# Patient Record
Sex: Female | Born: 1940 | Race: White | Hispanic: No | Marital: Married | Smoking: Former smoker
Health system: Southern US, Community
[De-identification: ages and names within clinical notes are randomized; demographics above are authoritative.]

## PROBLEM LIST (undated history)

## (undated) DIAGNOSIS — I341 Nonrheumatic mitral (valve) prolapse: Secondary | ICD-10-CM

## (undated) DIAGNOSIS — E785 Hyperlipidemia, unspecified: Secondary | ICD-10-CM

## (undated) DIAGNOSIS — N3281 Overactive bladder: Secondary | ICD-10-CM

## (undated) DIAGNOSIS — J449 Chronic obstructive pulmonary disease, unspecified: Secondary | ICD-10-CM

## (undated) DIAGNOSIS — I2510 Atherosclerotic heart disease of native coronary artery without angina pectoris: Secondary | ICD-10-CM

## (undated) DIAGNOSIS — Z8679 Personal history of other diseases of the circulatory system: Secondary | ICD-10-CM

## (undated) DIAGNOSIS — I872 Venous insufficiency (chronic) (peripheral): Secondary | ICD-10-CM

## (undated) DIAGNOSIS — I213 ST elevation (STEMI) myocardial infarction of unspecified site: Secondary | ICD-10-CM

## (undated) HISTORY — DX: Nonrheumatic mitral (valve) prolapse: I34.1

## (undated) HISTORY — DX: Chronic obstructive pulmonary disease, unspecified: J44.9

## (undated) HISTORY — PX: DILATION AND CURETTAGE OF UTERUS: SHX78

## (undated) HISTORY — DX: Hyperlipidemia, unspecified: E78.5

## (undated) HISTORY — PX: COLONOSCOPY: SHX174

## (undated) HISTORY — PX: BREAST LUMPECTOMY: SHX2

## (undated) HISTORY — DX: Overactive bladder: N32.81

## (undated) HISTORY — DX: Atherosclerotic heart disease of native coronary artery without angina pectoris: I25.10

## (undated) HISTORY — DX: ST elevation (STEMI) myocardial infarction of unspecified site: I21.3

## (undated) HISTORY — DX: Personal history of other diseases of the circulatory system: Z86.79

## (undated) HISTORY — PX: MELANOMA EXCISION: SHX5266

## (undated) HISTORY — DX: Venous insufficiency (chronic) (peripheral): I87.2

## (undated) HISTORY — PX: CATARACT EXTRACTION: SUR2

---

## 2006-09-21 ENCOUNTER — Other Ambulatory Visit: Admission: RE | Admit: 2006-09-21 | Discharge: 2006-09-21 | Payer: Self-pay | Admitting: Family Medicine

## 2007-08-11 ENCOUNTER — Ambulatory Visit (HOSPITAL_COMMUNITY): Admission: RE | Admit: 2007-08-11 | Discharge: 2007-08-11 | Payer: Self-pay | Admitting: Ophthalmology

## 2007-09-29 ENCOUNTER — Ambulatory Visit (HOSPITAL_COMMUNITY): Admission: RE | Admit: 2007-09-29 | Discharge: 2007-09-29 | Payer: Self-pay | Admitting: Ophthalmology

## 2008-05-23 HISTORY — PX: US ECHOCARDIOGRAPHY: HXRAD669

## 2008-05-23 HISTORY — PX: NM MYOCAR PERF WALL MOTION: HXRAD629

## 2009-03-06 ENCOUNTER — Inpatient Hospital Stay (HOSPITAL_COMMUNITY): Admission: EM | Admit: 2009-03-06 | Discharge: 2009-03-08 | Payer: Self-pay | Admitting: Emergency Medicine

## 2009-03-07 HISTORY — PX: CARDIAC CATHETERIZATION: SHX172

## 2009-08-03 DIAGNOSIS — I213 ST elevation (STEMI) myocardial infarction of unspecified site: Secondary | ICD-10-CM

## 2009-08-03 HISTORY — DX: ST elevation (STEMI) myocardial infarction of unspecified site: I21.3

## 2009-10-15 ENCOUNTER — Ambulatory Visit (HOSPITAL_COMMUNITY): Admission: RE | Admit: 2009-10-15 | Discharge: 2009-10-15 | Payer: Self-pay | Admitting: Family Medicine

## 2010-07-05 HISTORY — PX: ABLATION SAPHENOUS VEIN W/ RFA: SUR11

## 2010-07-11 LAB — HM MAMMOGRAPHY

## 2010-07-20 IMAGING — CT CT CHEST W/ CM
3 series · 17 of 29 positions shown, 19 images · IV contrast (80 ml omni 300)
Comparison: Plain film of earlier today.

CLINICAL DATA: Chest pain.  Evaluate anterior lung density on plain
film.

CT CHEST WITH CONTRAST
TECHNIQUE: Multidetector CT imaging of the chest was performed
following the standard protocol during bolus administration of
intravenous contrast.
Contrast: 80 ml Rmnipaque-ZPP

[Series 2: routine chest · axial · 0.59mm/px · z∈[-276,-56]mm · 7 of 62 slices shown, 9 images]
[im 9/62  mediastinal]
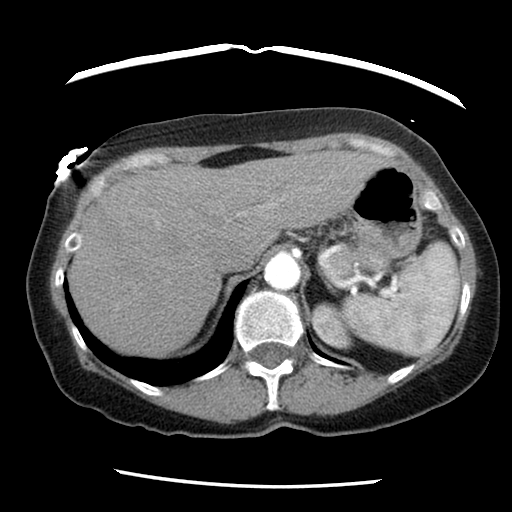
[im 9/62  lung]
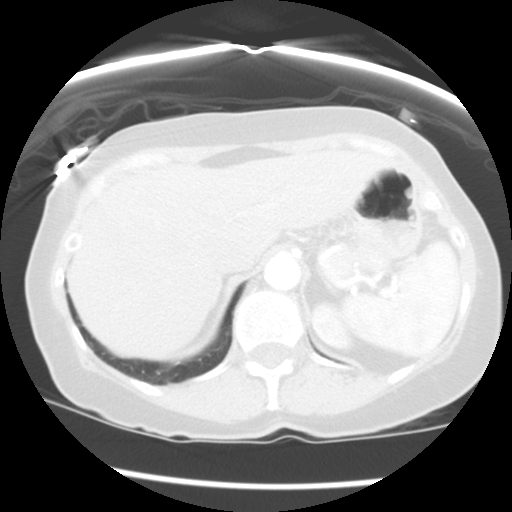
[im 18/62  lung]
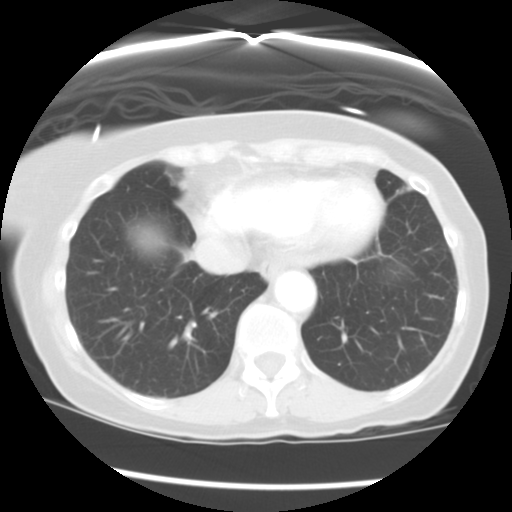
[im 27/62  lung]
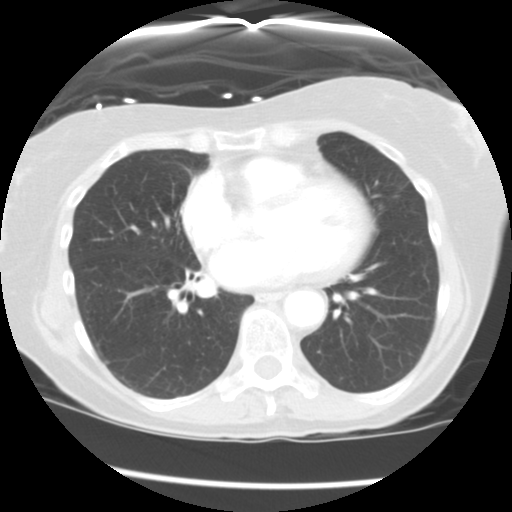
[im 30/62  lung]
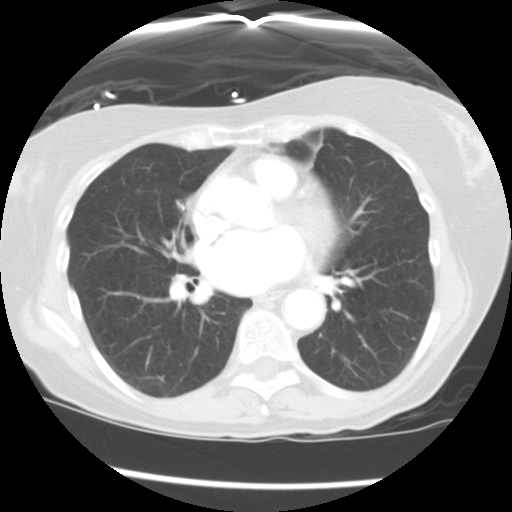
[im 35/62  mediastinal]
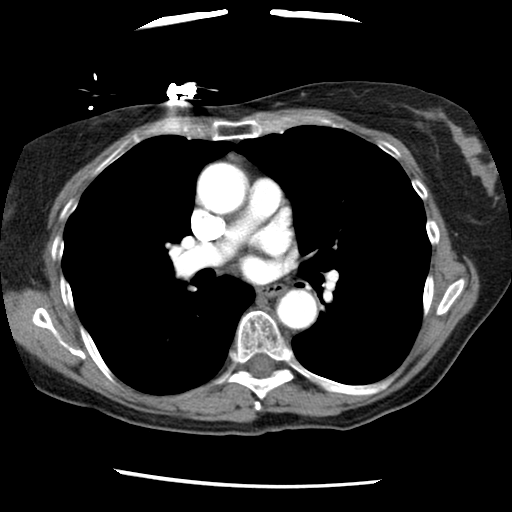
[im 35/62  lung]
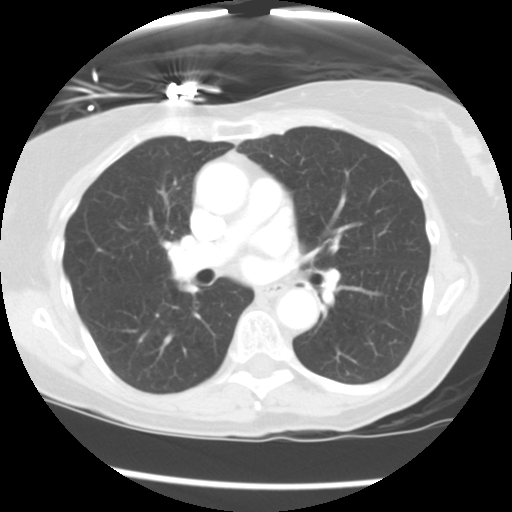
[im 44/62  lung]
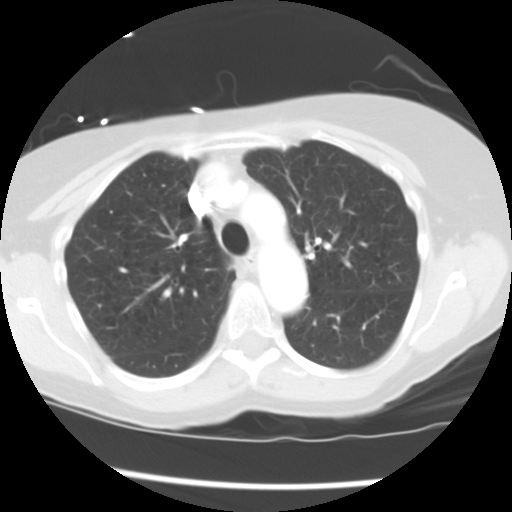
[im 53/62  lung]
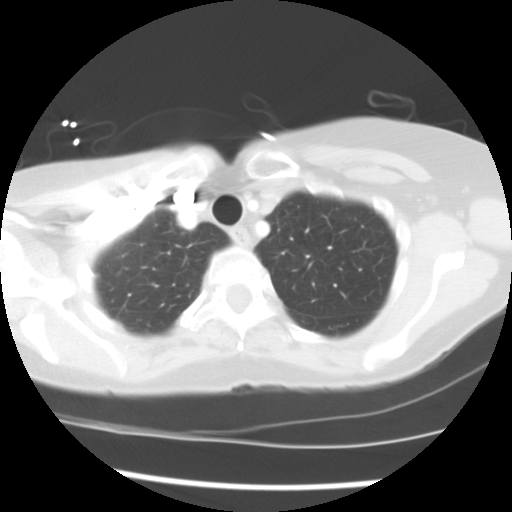

[Series 400: reformatted · sagittal · 0.61mm/px · 8 of 84 slices shown (1 of 2)]
[im 9/84  lung]
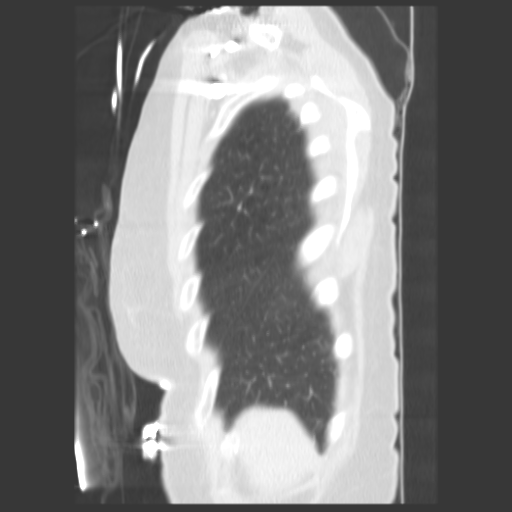
[im 17/84  lung]
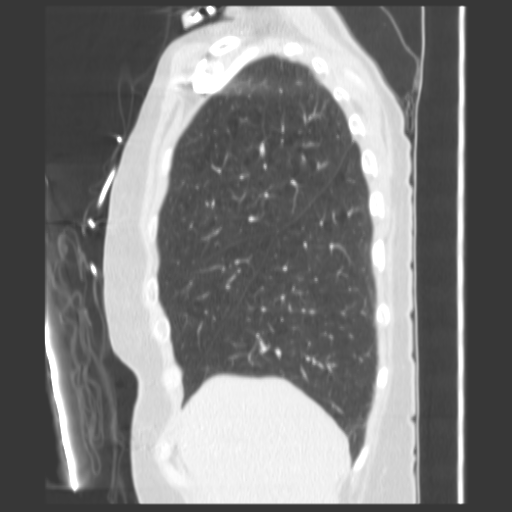
[im 25/84  lung]
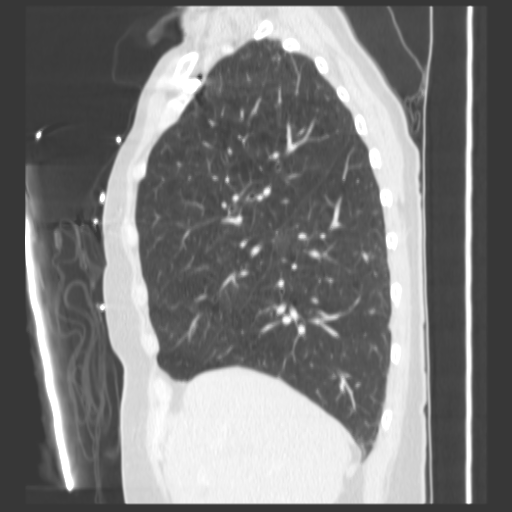
[im 34/84  lung]
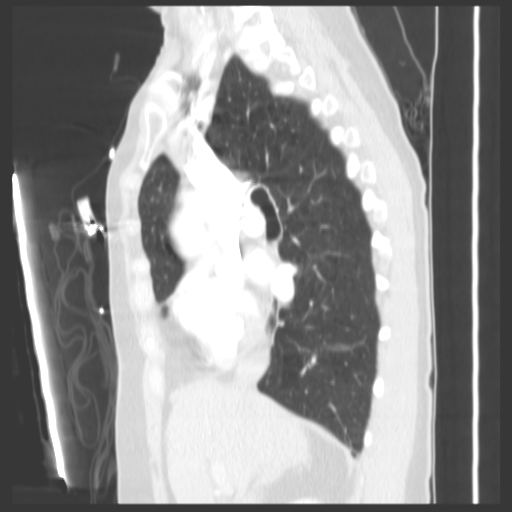
[im 50/84  lung]
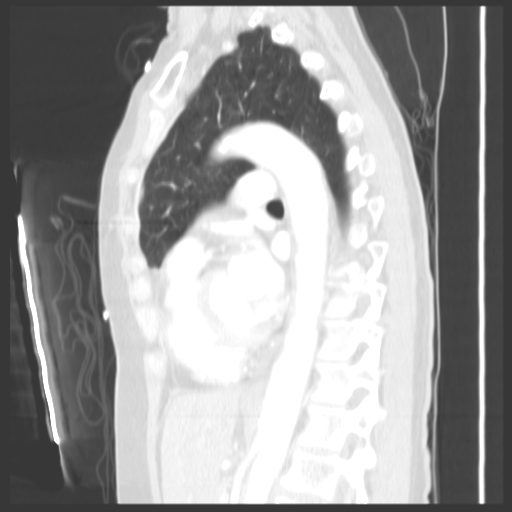
[im 59/84  lung]
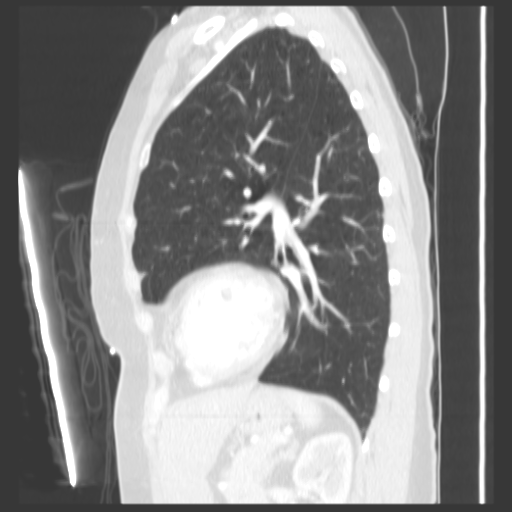
[im 67/84  lung]
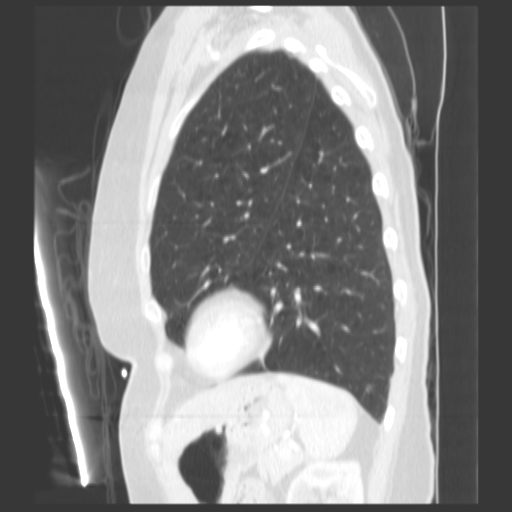
[im 75/84  lung]
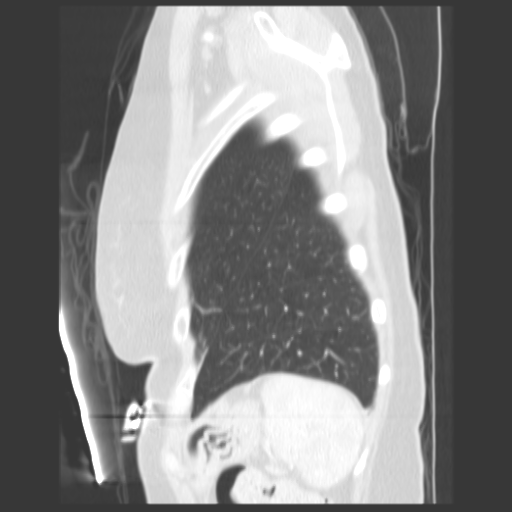

[Series 401: reformatted · coronal · 0.61mm/px · 2 of 64 slices shown (2 of 2)]
[im 8/64  lung]
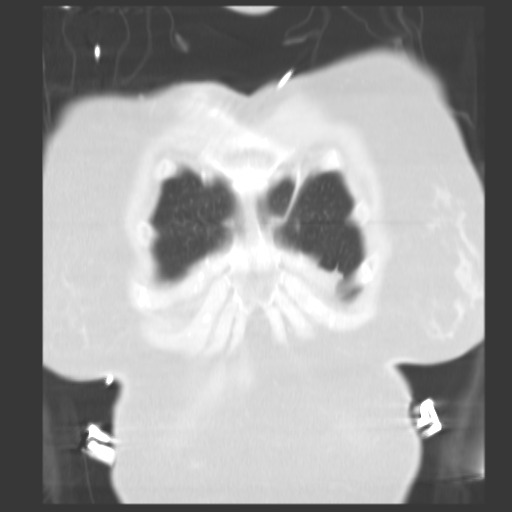
[im 16/64  lung]
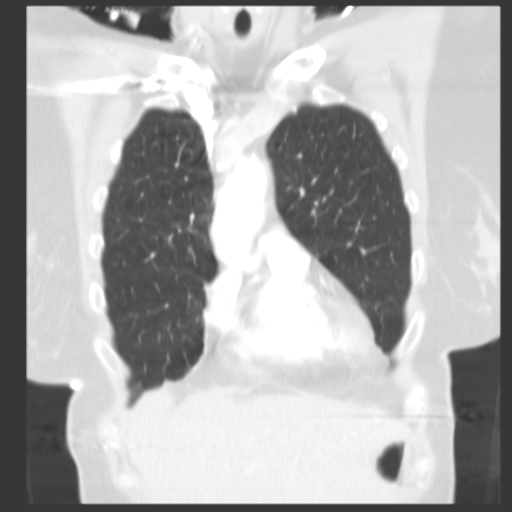

[17 of 29 positions shown; findings below may reference images not displayed]

FINDINGS: Lung windows demonstrate mild to moderate centrilobular
emphysema.  The plain film abnormality corresponds to a right upper
lobe calcified nodule consistent with a benign granuloma.  7 mm on
image 32.

2 mm right lower lobe nodule on image 42.

Soft tissue windows demonstrate mild cardiomegaly accentuated by
pectus excavatum deformity. No pericardial or pleural effusion.  No
mediastinal or hilar adenopathy. Mild left atrial enlargement.

Limited abdominal imaging demonstrates no significant findings.
Mild osteopenia.  Probable fatty infiltration of the liver.
IMPRESSION: 1. No acute process in the chest.
2.  The plain film abnormality corresponds to a right upper lobe
benign granuloma.
3.  Centrilobular emphysema with a 2 mm right lower lobe lung
nodule. Given risk factors for bronchogenic carcinoma, follow-up
chest CT at 1 year is recommended.  This recommendation follows the
consensus statement:  "Guidelines for Management of Small Pulmonary
Nodules Detected on CT Scans:  A Statement from the [employer]
online at:  [URL]
4.  Probable fatty infiltration of the liver.
5.  Cardiomegaly and left atrial enlargement accentuated by pectus
excavatum deformity.

## 2010-10-13 ENCOUNTER — Other Ambulatory Visit: Payer: Self-pay | Admitting: Family Medicine

## 2010-10-13 DIAGNOSIS — R918 Other nonspecific abnormal finding of lung field: Secondary | ICD-10-CM

## 2010-10-15 ENCOUNTER — Ambulatory Visit (HOSPITAL_COMMUNITY)
Admission: RE | Admit: 2010-10-15 | Discharge: 2010-10-15 | Disposition: A | Discharge status: Home / Self Care | Payer: Medicare Other | Source: Ambulatory Visit | Attending: Family Medicine | Admitting: Family Medicine

## 2010-10-15 DIAGNOSIS — J984 Other disorders of lung: Secondary | ICD-10-CM | POA: Insufficient documentation

## 2010-10-15 DIAGNOSIS — Z09 Encounter for follow-up examination after completed treatment for conditions other than malignant neoplasm: Secondary | ICD-10-CM | POA: Insufficient documentation

## 2010-10-15 DIAGNOSIS — R918 Other nonspecific abnormal finding of lung field: Secondary | ICD-10-CM

## 2010-11-06 ENCOUNTER — Encounter: Payer: Self-pay | Admitting: Nurse Practitioner

## 2010-11-08 LAB — COMPREHENSIVE METABOLIC PANEL
ALT: 26 U/L (ref 0–35)
AST: 32 U/L (ref 0–37)
Alkaline Phosphatase: 41 U/L (ref 39–117)
CO2: 24 mEq/L (ref 19–32)
Calcium: 8.8 mg/dL (ref 8.4–10.5)
Chloride: 109 mEq/L (ref 96–112)
Creatinine, Ser: 0.9 mg/dL (ref 0.4–1.2)
Glucose, Bld: 87 mg/dL (ref 70–99)
Sodium: 138 mEq/L (ref 135–145)
Total Protein: 5.8 g/dL — ABNORMAL LOW (ref 6.0–8.3)

## 2010-11-08 LAB — LIPID PANEL
Total CHOL/HDL Ratio: 2 RATIO
VLDL: 19 mg/dL (ref 0–40)

## 2010-11-08 LAB — CARDIAC PANEL(CRET KIN+CKTOT+MB+TROPI)
CK, MB: 7.4 ng/mL — ABNORMAL HIGH (ref 0.3–4.0)
Relative Index: INVALID (ref 0.0–2.5)
Total CK: 85 U/L (ref 7–177)
Total CK: 97 U/L (ref 7–177)

## 2010-11-08 LAB — CBC
HCT: 38.2 % (ref 36.0–46.0)
HCT: 39.1 % (ref 36.0–46.0)
Hemoglobin: 12.9 g/dL (ref 12.0–15.0)
Hemoglobin: 13.3 g/dL (ref 12.0–15.0)
MCHC: 33.9 g/dL (ref 30.0–36.0)
MCHC: 34 g/dL (ref 30.0–36.0)
MCV: 91.5 fL (ref 78.0–100.0)
MCV: 92 fL (ref 78.0–100.0)
Platelets: 190 10*3/uL (ref 150–400)
RBC: 4.18 MIL/uL (ref 3.87–5.11)
RBC: 4.26 MIL/uL (ref 3.87–5.11)
RBC: 4.46 MIL/uL (ref 3.87–5.11)
RDW: 13.8 % (ref 11.5–15.5)
WBC: 5.4 10*3/uL (ref 4.0–10.5)

## 2010-11-08 LAB — BASIC METABOLIC PANEL
BUN: 5 mg/dL — ABNORMAL LOW (ref 6–23)
CO2: 23 mEq/L (ref 19–32)
Chloride: 111 mEq/L (ref 96–112)
Creatinine, Ser: 0.77 mg/dL (ref 0.4–1.2)
Potassium: 4.1 mEq/L (ref 3.5–5.1)

## 2010-11-08 LAB — POCT I-STAT, CHEM 8
Calcium, Ion: 1.14 mmol/L (ref 1.12–1.32)
Chloride: 107 mEq/L (ref 96–112)
Creatinine, Ser: 1 mg/dL (ref 0.4–1.2)
Glucose, Bld: 89 mg/dL (ref 70–99)
HCT: 42 % (ref 36.0–46.0)
Hemoglobin: 14.3 g/dL (ref 12.0–15.0)
TCO2: 25 mmol/L (ref 0–100)

## 2010-11-08 LAB — DIFFERENTIAL
Basophils Absolute: 0 10*3/uL (ref 0.0–0.1)
Basophils Relative: 1 % (ref 0–1)
Eosinophils Absolute: 0.1 10*3/uL (ref 0.0–0.7)

## 2010-11-08 LAB — POCT CARDIAC MARKERS: Troponin i, poc: 0.08 ng/mL (ref 0.00–0.09)

## 2010-11-08 LAB — PROTIME-INR
INR: 0.9 (ref 0.00–1.49)
Prothrombin Time: 12.5 seconds (ref 11.6–15.2)

## 2010-11-08 LAB — MAGNESIUM: Magnesium: 2.4 mg/dL (ref 1.5–2.5)

## 2010-11-08 LAB — CK TOTAL AND CKMB (NOT AT ARMC): CK, MB: 3.4 ng/mL (ref 0.3–4.0)

## 2010-12-16 NOTE — H&P (Signed)
NAME:  Christine Sandoval, Christine Sandoval                ACCOUNT NO.:  [id number]   MEDICAL RECORD NO.:  [id number]          PATIENT TYPE:  EMS   LOCATION:  MAJO                         FACILITY:  [facility]   PHYSICIAN:  Joylene John, MD       DATE OF BIRTH:  09/20/1940   DATE OF ADMISSION:  03/06/2009  DATE OF DISCHARGE:                              HISTORY & PHYSICAL   REASON FOR ADMISSION:  Chest pain.   HISTORY OF PRESENT ILLNESS:  This is a 70 year old white female with  past medical history of MI at age 32, coming in with chest pain similar  to her episode when she was 78.  According to the patient, the pain  started earlier this morning.  She was not physically exerting at that  time when it started.  According to the patient, the pain was squeezing  in nature, it is retrosternal in location with radiating to the front  and back, 3-4 on a scale of 10.  The patient tells me the pain was  associated with nausea, diaphoresis, and jaw pain.  It was similar to  the episode that she had when she was 40.  The patient tells me at that  time she had an MI because of her lifestyle which included excessive  smoking and drinking.  The patient denies any change in her exercise  tolerance.  The patient denies any fevers, chills, or shortness of  breath.  No aggravating factors for the pain and the pain relieved about  1-1/2 hours after it started.  The patient thinks that the aspirin  helped.   PAST MEDICAL HISTORY:  1. MI x2 at age 45 when she was in [place].  This is the first time      that the patient is at this hospital.  2. Hypertension, borderline, on no meds.   FAMILY HISTORY:  No family history of coronary artery disease.   SOCIAL HISTORY:  Lives with family, is currently visiting.  No smoking  or alcohol since 1983.   ALLERGIES:  CODEINE.   PHYSICAL EXAMINATION:  VITAL SIGNS:  Temperature 97.5, pulse of 59,  respirations 18, blood pressure 147/86, and O2 sat is 98% on room air.  HEENT:  No  icterus, pallor, or JVD appreciated.  CARDIOVASCULAR:  Regular rate and rhythm.  LUNGS:  Clear to auscultation.  LOWER EXTREMITIES:  No edema appreciated.  Left lower extremity, she  does have varicose veins.   IMAGING:  EKG:  Normal sinus rhythm at 58 plus bradycardia.  No ST-T  changes.  No old EKG to compare.   LABORATORY DATA:  First set of cardiac enzymes as negative, troponin  though is borderline with 0.08.  Hemoglobin 14.3 and hematocrit 42.  White count and platelets are not available.  Sodium 138, potassium 4.1,  chloride 107, bicarb 25, BUN 15, creatinine 1, and glucose 89.  Chest x-  ray:  COPD.  No acute pathology noted.   ASSESSMENT AND PLAN:  Plan is to admit the patient to a tele bed to do  serial cardiac enzymes.  The patient is on  multivitamins, we will hold  for now.  The patient is not on any blood pressure medications.  His  blood pressure is at goal.  We will hold blood pressure medications at  this time to avoid hypotension.  We will not put the patient on a beta-  blocker since her heart rate is already in the 50s.  We will continue  the aspirin and the sublingual nitro once the  patient is ruled out with tele and serial cardiac enzymes.  The patient  does have a PCP on the outside and she tells me that she had a complete  cardiac workup including a stress test last year.  So, a decision is to  be made as the patient needs to be in the hospital for further workup or  if an outpatient appointment can be set up.      Joylene John, MD  Electronically Signed     RP/MEDQ  D:  03/06/2009  T:  03/07/2009  Job:  [id number]

## 2010-12-16 NOTE — Discharge Summary (Signed)
NAME:  Christine Sandoval, Christine Sandoval                ACCOUNT NO.:  [id number]   MEDICAL RECORD NO.:  [id number]          PATIENT TYPE:  INP   LOCATION:  3705                         FACILITY:  [facility]   PHYSICIAN:  Monte Fantasia, MD  DATE OF BIRTH:  10/05/1940   DATE OF ADMISSION:  03/06/2009  DATE OF DISCHARGE:  03/08/2009                               DISCHARGE SUMMARY   PRIMARY CARE PHYSICIAN:  Ernestina Penna, MD, in [place].   DISCHARGE DIAGNOSIS:  Acute coronary syndrome, status post cardiac, cath  is normal.   MEDICATIONS:  1. Aspirin 325 mg p.o. daily.  2. Vytorin 10/20 one tablet p.o. at bedtime.  3. Vesicare 5 mg p.o. at bedtime.  4. Calcium carbonate 4 times a day.  5. Aspirin 81 mg p.o. daily.  6. Actonel 150 mg p.o. monthly.  7. Toprol-XL 25 mg half a tablet p.o. daily.   COURSE DURING THE HOSPITAL STAY:  Ms. Tuller is a pleasant 70 year old  Caucasian lady patient was admitted on March 06, 2009, with complaints  of increasing chest pain.  She stated that her chest pain was similar to  the episode at the time she was 40.  The patient had been admitted to  telemetry bed and cardiac enzymes were cycled, which showed elevated  troponins of 0.63 and 0.61.  The patient was evaluated by [facility] and underwent a cardiac cath on March 07, 2009, as  per the cath report.  The patient had normal coronaries, normal LV  function, and recommended that the patient was medically stable to be  discharged and to follow up with Dr. Lynnea Ferrier as an outpatient in the  [place] office on March 25, 2009, at 9:30.  The patient at present  is medically stable to be discharged and can be discharged home.  The  patient is recommended to follow up with her primary care physician as  an outpatient.   RADIOLOGICAL INVESTIGATIONS DONE DURING THE STAY IN THE HOSPITAL:  CT  chest with contrast done on March 07, 2009.  Impression:  No acute  process in the chest.  Plain film abdominal  that corresponds to right  upper lobe benign granuloma, centrilobular emphysema with 2 mm right  lower lobe lung nodule.  Given the risk factors for bronchogenic  carcinoma, follow up CT chest in a year recommended.  Probable fatty  infiltration of the liver, cardiomegaly, and left atrial enlargement  accentuated by pectus excavatum deformity.   Chest x-ray done on March 07, 2009.  Impression:  Slight hyperinflation  and configuration with no evidence of pneumonia or pulmonary edema.  On  the lateral image, there is a nodular density opacity in the  retrosternal region, measures about 8.8 into 9.8 mm, the margins of  slightly irregular, may reflect a granuloma, but concerned about a  slight spiculation.   LABORATORIES DONE DURING THE STAY IN THE HOSPITAL:  Total WBC 5.4,  hemoglobin 13.3, hematocrit 39.1, and platelets of 173.  Sodium 138,  potassium 4.1, chloride 111, bicarb 23, BUN 5, creatinine 0.77, calcium  of 8.3, total cholesterol 161, triglycerides  93, HDL 79, LDL 63, and  VLDL 19.   DISPOSITION:  The patient is medically stable to be discharge.  She is  recommended to follow up with Dr. Lynnea Ferrier as an outpatient in the  [place] office on March 25, 2009, at 9:30 a.m.  The patient is also  recommended to follow up with her primary care physician.  The patient  would need a repeat CT chest with contrast as a follow up in a year for  possible granulomatous nodularity in the right lower lobe.   TOTAL TIME FOR DISCHARGE:  40 minutes, include 20 minutes of counseling.       Monte Fantasia, MD  Electronically Signed     MP/MEDQ  D:  03/08/2009  T:  03/08/2009  Job:  [id number]   cc:   Ernestina Penna, M.D.  Ritta Slot, MD

## 2011-01-23 ENCOUNTER — Ambulatory Visit (AMBULATORY_SURGERY_CENTER): Payer: Medicare Other | Admitting: *Deleted

## 2011-01-23 VITALS — Ht 63.0 in | Wt 129.0 lb

## 2011-01-23 DIAGNOSIS — Z86010 Personal history of colonic polyps: Secondary | ICD-10-CM

## 2011-01-23 MED ORDER — PEG-KCL-NACL-NASULF-NA ASC-C 100 G PO SOLR: 1.0000 | Freq: Once | ORAL | Status: AC

## 2011-01-23 NOTE — Progress Notes (Signed)
Patient has history of polyps. She is going to try to find name of [facility] hospital where most recent colonoscopy was performed by Dr. Nelva Nay (sp?). She is mailing [place] a signed release of information form as soon as she finds this info, for [place] to fax to facility. Is unsure of [place] hospital's name or the doctor that performed her first colonoscopy.

## 2011-02-02 ENCOUNTER — Telehealth: Payer: Self-pay | Admitting: Internal Medicine

## 2011-02-02 MED ORDER — PEG-KCL-NACL-NASULF-NA ASC-C 100 G PO SOLR: ORAL | Status: DC

## 2011-02-02 NOTE — Telephone Encounter (Signed)
SPOKE WITH PATIENT AND LET HER KNOW THAT I  RESENT HER MOVIPREP INTO [facility]. I ALSO CALLED PHARMACY AND LEFT MESSAGE FOR THIS RX. Christine Sandoval

## 2011-02-05 ENCOUNTER — Encounter: Payer: Self-pay | Admitting: Internal Medicine

## 2011-02-05 ENCOUNTER — Ambulatory Visit (AMBULATORY_SURGERY_CENTER): Payer: Medicare Other | Admitting: Internal Medicine

## 2011-02-05 VITALS — BP 154/76 | HR 64 | Temp 96.8°F | Resp 12 | Ht 63.0 in | Wt 129.0 lb

## 2011-02-05 DIAGNOSIS — Z1211 Encounter for screening for malignant neoplasm of colon: Secondary | ICD-10-CM

## 2011-02-05 DIAGNOSIS — Z86010 Personal history of colonic polyps: Secondary | ICD-10-CM

## 2011-02-05 LAB — HM COLONOSCOPY

## 2011-02-05 MED ORDER — SODIUM CHLORIDE 0.9 % IV SOLN: 500.0000 mL | INTRAVENOUS | Status: DC

## 2011-02-05 NOTE — Patient Instructions (Signed)
Follow discharge instructions.  Continue your medications.  Follow up Colonoscopy in 5 years.

## 2011-02-06 ENCOUNTER — Telehealth: Payer: Self-pay | Admitting: *Deleted

## 2011-02-06 NOTE — Telephone Encounter (Signed)

## 2011-05-08 LAB — BASIC METABOLIC PANEL
BUN: 24 — ABNORMAL HIGH
CO2: 28
Calcium: 10.6 — ABNORMAL HIGH
Glucose, Bld: 93
Sodium: 137

## 2013-02-07 ENCOUNTER — Other Ambulatory Visit: Payer: Self-pay | Admitting: Nurse Practitioner

## 2013-02-15 ENCOUNTER — Other Ambulatory Visit (INDEPENDENT_AMBULATORY_CARE_PROVIDER_SITE_OTHER): Payer: Medicare Other

## 2013-02-15 DIAGNOSIS — Z0000 Encounter for general adult medical examination without abnormal findings: Secondary | ICD-10-CM

## 2013-02-15 DIAGNOSIS — E785 Hyperlipidemia, unspecified: Secondary | ICD-10-CM

## 2013-02-15 DIAGNOSIS — I10 Essential (primary) hypertension: Secondary | ICD-10-CM

## 2013-02-17 ENCOUNTER — Other Ambulatory Visit: Payer: Self-pay

## 2013-02-21 ENCOUNTER — Ambulatory Visit (INDEPENDENT_AMBULATORY_CARE_PROVIDER_SITE_OTHER): Payer: Medicare Other | Admitting: Family Medicine

## 2013-02-21 ENCOUNTER — Encounter: Payer: Self-pay | Admitting: Family Medicine

## 2013-02-21 VITALS — BP 117/74 | HR 79 | Temp 98.4°F | Ht 63.0 in | Wt 126.6 lb

## 2013-02-21 DIAGNOSIS — M810 Age-related osteoporosis without current pathological fracture: Secondary | ICD-10-CM | POA: Insufficient documentation

## 2013-02-21 DIAGNOSIS — J449 Chronic obstructive pulmonary disease, unspecified: Secondary | ICD-10-CM | POA: Insufficient documentation

## 2013-02-21 DIAGNOSIS — E785 Hyperlipidemia, unspecified: Secondary | ICD-10-CM | POA: Insufficient documentation

## 2013-02-21 DIAGNOSIS — I2510 Atherosclerotic heart disease of native coronary artery without angina pectoris: Secondary | ICD-10-CM | POA: Insufficient documentation

## 2013-02-21 DIAGNOSIS — I201 Angina pectoris with documented spasm: Secondary | ICD-10-CM | POA: Insufficient documentation

## 2013-02-21 LAB — HEPATIC FUNCTION PANEL
ALT: 47 U/L — ABNORMAL HIGH (ref 0–35)
AST: 52 U/L — ABNORMAL HIGH (ref 0–37)
Bilirubin, Direct: 0.1 mg/dL (ref 0.0–0.3)
Indirect Bilirubin: 0.3 mg/dL (ref 0.0–0.9)

## 2013-02-21 LAB — BASIC METABOLIC PANEL WITH GFR
CO2: 28 mEq/L (ref 19–32)
Calcium: 9.8 mg/dL (ref 8.4–10.5)
Glucose, Bld: 97 mg/dL (ref 70–99)
Potassium: 5 mEq/L (ref 3.5–5.3)
Sodium: 141 mEq/L (ref 135–145)

## 2013-02-21 NOTE — Addendum Note (Signed)
Addended by: Orma Render F on: 02/21/2013 10:31 AM   Modules accepted: Orders

## 2013-02-21 NOTE — Progress Notes (Signed)
Patient ID: Christine Sandoval, female   DOB: Aug 15, 1940, 72 y.o.   MRN: [id number] SUBJECTIVE: CC: Chief Complaint  Patient presents with  . Follow-up    discuss labs   wants off vytotin taking 1/2 dose now  no leg spasms no more diarrhea    HPI: Breakfast: flat wheat bread with cheese and mustard, sliced [place] Lunch: largest meal, baked fried chicken, green beans, tomatoes. broccoli Dinner: fruit cup teaspoon of soluble fiber crackers. Snack at night diet fudgcicle  Patient is here for follow up of hyperlipidemia: denies Headache;denies Chest Pain;denies weakness;denies Shortness of Breath and orthopnea;denies Visual changes;denies palpitations;denies cough;denies pedal edema;denies symptoms of TIA or stroke;deniesClaudication symptoms. admits to Compliance with medications; Problems with medications. And reduced the Vytorin by 1/2 and symptoms of myalgias has resolved.  Past Medical History  Diagnosis Date  . CAD (coronary artery disease)   . OAB (overactive bladder)   . Hyperlipidemia   . Osteoporosis   . Mitral valve prolapse     mild  . COPD (chronic obstructive pulmonary disease)    Past Surgical History  Procedure Laterality Date  . Colonoscopy      history of polyps  . Melanoma excision    . Breast lumpectomy      benign  . Dilation and curettage of uterus       X 2  . Cataract extraction      bilateral eyes   History   Social History  . Marital Status: Married    Spouse Name: N/A    Number of Children: N/A  . Years of Education: N/A   Occupational History  . Not on file.   Social History Main Topics  . Smoking status: Former Smoker    Types: Cigarettes    Quit date: 08/03/1988  . Smokeless tobacco: Never Used  . Alcohol Use: No  . Drug Use: No  . Sexually Active: Not on file   Other Topics Concern  . Not on file   Social History Narrative  . No narrative on file   Family History  Problem Relation Age of Onset  . Colon cancer Maternal Aunt    . Colon cancer Maternal Uncle   . Alcohol abuse Mother   . Heart disease Sister 32    CHF   Current Outpatient Prescriptions on File Prior to Visit  Medication Sig Dispense Refill  . Ascorbic Acid (VITAMIN C) 1000 MG tablet Take 1,000 mg by mouth daily.        Marland Kitchen aspirin 81 MG tablet Take 162 mg by mouth daily.       . B Complex Vitamins (B-COMPLEX/B-12 PO) Take 1 capsule by mouth daily.        . calcium carbonate 200 MG capsule Take 1,000 mg by mouth 2 (two) times daily with a meal.        . ezetimibe-simvastatin (VYTORIN) 10-40 MG per tablet Take 1 tablet by mouth at bedtime.        . fish oil-omega-3 fatty acids 1000 MG capsule Take 1 g by mouth daily.        . Multiple Vitamin (MULTIVITAMIN) capsule Take 1 capsule by mouth daily.        . Cholecalciferol (VITAMIN D) 1000 UNITS capsule Take 4,000 Units by mouth daily.        . Cranberry 450 MG CAPS Take 1 capsule by mouth.        . peg 3350 powder (MOVIPREP) 100 G SOLR MOVIPREP-TAKE AS DIRECTED  1  kit  0   Current Facility-Administered Medications on File Prior to Visit  Medication Dose Route Frequency Provider Last Rate Last Dose  . 0.9 %  sodium chloride infusion  500 mL Intravenous Continuous Hilarie Fredrickson, MD       Allergies  Allergen Reactions  . Codeine   . Hydrochlorothiazide    Immunization History  Administered Date(s) Administered  . Influenza Whole 05/04/2007, 05/03/2009  . Td 03/21/2008   Prior to Admission medications   Medication Sig Start Date End Date Taking? Authorizing Provider  Arginine 500 MG CAPS Take 1 capsule by mouth daily. L-Arginine   Yes Historical Provider, MD  Ascorbic Acid (VITAMIN C) 1000 MG tablet Take 1,000 mg by mouth daily.     Yes Historical Provider, MD  aspirin 81 MG tablet Take 162 mg by mouth daily.    Yes Historical Provider, MD  B Complex Vitamins (B-COMPLEX/B-12 PO) Take 1 capsule by mouth daily.     Yes Historical Provider, MD  calcium carbonate 200 MG capsule Take 1,000 mg by mouth  2 (two) times daily with a meal.     Yes Historical Provider, MD  Coenzyme Q10 (CO Q 10) 100 MG CAPS Take 1 capsule by mouth daily.   Yes Historical Provider, MD  ezetimibe-simvastatin (VYTORIN) 10-40 MG per tablet Take 1 tablet by mouth at bedtime.     Yes Historical Provider, MD  fish oil-omega-3 fatty acids 1000 MG capsule Take 1 g by mouth daily.     Yes Historical Provider, MD  Chilton Si Tea, Camillia sinensis, ([employer] SUPER GREEN TEA EXTRACT) 250 MG CAPS Take 1 capsule by mouth daily. Takes 350 egcg bid   Yes Historical Provider, MD  Multiple Vitamin (MULTIVITAMIN) capsule Take 1 capsule by mouth daily.     Yes Historical Provider, MD  vitamin B-12 (CYANOCOBALAMIN) 1000 MCG tablet Take 1,000 mcg by mouth daily.   Yes Historical Provider, MD  Cholecalciferol (VITAMIN D) 1000 UNITS capsule Take 4,000 Units by mouth daily.      Historical Provider, MD  Cranberry 450 MG CAPS Take 1 capsule by mouth.      Historical Provider, MD  peg 3350 powder (MOVIPREP) 100 G SOLR MOVIPREP-TAKE AS DIRECTED 02/02/11   Hilarie Fredrickson, MD      Past Medical History  Diagnosis Date  . CAD (coronary artery disease)   . OAB (overactive bladder)   . Hyperlipidemia   . Osteoporosis   . Mitral valve prolapse     mild  . COPD (chronic obstructive pulmonary disease)    Past Surgical History  Procedure Laterality Date  . Colonoscopy      history of polyps  . Melanoma excision    . Breast lumpectomy      benign  . Dilation and curettage of uterus       X 2  . Cataract extraction      bilateral eyes   History   Social History  . Marital Status: Married    Spouse Name: N/A    Number of Children: N/A  . Years of Education: N/A   Occupational History  . Not on file.   Social History Main Topics  . Smoking status: Former Smoker    Types: Cigarettes    Quit date: 08/03/1988  . Smokeless tobacco: Never Used  . Alcohol Use: No  . Drug Use: No  . Sexually Active: Not on file   Other Topics Concern  . Not  on file   Social History  Narrative  . No narrative on file   Family History  Problem Relation Age of Onset  . Colon cancer Maternal Aunt   . Colon cancer Maternal Uncle   . Alcohol abuse Mother   . Heart disease Sister 22    CHF   Current Outpatient Prescriptions on File Prior to Visit  Medication Sig Dispense Refill  . Ascorbic Acid (VITAMIN C) 1000 MG tablet Take 1,000 mg by mouth daily.        Marland Kitchen aspirin 81 MG tablet Take 162 mg by mouth daily.       . B Complex Vitamins (B-COMPLEX/B-12 PO) Take 1 capsule by mouth daily.        . calcium carbonate 200 MG capsule Take 1,000 mg by mouth 2 (two) times daily with a meal.        . ezetimibe-simvastatin (VYTORIN) 10-40 MG per tablet Take 1 tablet by mouth at bedtime.        . fish oil-omega-3 fatty acids 1000 MG capsule Take 1 g by mouth daily.        . Multiple Vitamin (MULTIVITAMIN) capsule Take 1 capsule by mouth daily.        . Cholecalciferol (VITAMIN D) 1000 UNITS capsule Take 4,000 Units by mouth daily.        . Cranberry 450 MG CAPS Take 1 capsule by mouth.        . peg 3350 powder (MOVIPREP) 100 G SOLR MOVIPREP-TAKE AS DIRECTED  1 kit  0   Current Facility-Administered Medications on File Prior to Visit  Medication Dose Route Frequency Provider Last Rate Last Dose  . 0.9 %  sodium chloride infusion  500 mL Intravenous Continuous Hilarie Fredrickson, MD       Allergies  Allergen Reactions  . Codeine   . Hydrochlorothiazide    Immunization History  Administered Date(s) Administered  . Influenza Whole 05/04/2007, 05/03/2009  . Td 03/21/2008   Prior to Admission medications   Medication Sig Start Date End Date Taking? Authorizing Provider  Arginine 500 MG CAPS Take 1 capsule by mouth daily. L-Arginine   Yes Historical Provider, MD  Ascorbic Acid (VITAMIN C) 1000 MG tablet Take 1,000 mg by mouth daily.     Yes Historical Provider, MD  aspirin 81 MG tablet Take 162 mg by mouth daily.    Yes Historical Provider, MD  B Complex  Vitamins (B-COMPLEX/B-12 PO) Take 1 capsule by mouth daily.     Yes Historical Provider, MD  calcium carbonate 200 MG capsule Take 1,000 mg by mouth 2 (two) times daily with a meal.     Yes Historical Provider, MD  Coenzyme Q10 (CO Q 10) 100 MG CAPS Take 1 capsule by mouth daily.   Yes Historical Provider, MD  ezetimibe-simvastatin (VYTORIN) 10-40 MG per tablet Take 1 tablet by mouth at bedtime.     Yes Historical Provider, MD  fish oil-omega-3 fatty acids 1000 MG capsule Take 1 g by mouth daily.     Yes Historical Provider, MD  Chilton Si Tea, Camillia sinensis, ([employer]S SUPER GREEN TEA EXTRACT) 250 MG CAPS Take 1 capsule by mouth daily. Takes 350 egcg bid   Yes Historical Provider, MD  Multiple Vitamin (MULTIVITAMIN) capsule Take 1 capsule by mouth daily.     Yes Historical Provider, MD  vitamin B-12 (CYANOCOBALAMIN) 1000 MCG tablet Take 1,000 mcg by mouth daily.   Yes Historical Provider, MD  Cholecalciferol (VITAMIN D) 1000 UNITS capsule Take 4,000 Units by mouth daily.  Historical Provider, MD  Cranberry 450 MG CAPS Take 1 capsule by mouth.      Historical Provider, MD  peg 3350 powder (MOVIPREP) 100 G SOLR MOVIPREP-TAKE AS DIRECTED 02/02/11   Hilarie Fredrickson, MD     ROS: As above in the HPI. All other systems are stable or negative.  OBJECTIVE: APPEARANCE:  Patient in no acute distress.The patient appeared well nourished and normally developed. Acyanotic. Waist: VITAL SIGNS:BP 117/74  Pulse 79  Temp(Src) 98.4 F (36.9 C) (Oral)  Ht 5\' 3"  (1.6 m)  Wt 126 lb 9.6 oz (57.425 kg)  BMI 22.43 kg/m2 Slim WF Looks well  SKIN: warm and  Dry without overt rashes, tattoos and scars  HEAD and Neck: without JVD, Head and scalp: normal Eyes:No scleral icterus. Fundi normal, eye movements normal. Ears: Auricle normal, canal normal, Tympanic membranes normal, insufflation normal. Nose: normal Throat: normal Neck & thyroid: normal  CHEST & LUNGS: Chest wall: normal Lungs: Clear  CVS: Reveals  the PMI to be normally located. Regular rhythm, First and Second Heart sounds are normal,  absence of murmurs, rubs or gallops. Peripheral vasculature: Radial pulses: normal Dorsal pedis pulses: normal Posterior pulses: normal  ABDOMEN:  Appearance: normal Benign, no organomegaly, no masses, no Abdominal Aortic enlargement. No Guarding , no rebound. No Bruits. Bowel sounds: normal  RECTAL: N/A GU: N/A  EXTREMETIES: nonedematous. Both Femoral and Pedal pulses are normal.  MUSCULOSKELETAL:  Spine: normal Joints: intact  NEUROLOGIC: oriented to time,place and person; nonfocal. Strength is normal Sensory is normal Reflexes are normal Cranial Nerves are normal.  ASSESSMENT: CAD (coronary artery disease)  Coronary vasospasm  HLD (hyperlipidemia)  COPD (chronic obstructive pulmonary disease)  Osteoporosis, unspecified   PLAN: No orders of the defined types were placed in this encounter.   Results for orders placed in visit on 11/06/10  HM MAMMOGRAPHY      Result Value Range   HM Mammogram done    HM DEXA SCAN      Result Value Range   HM Dexa Scan done    HM PAP SMEAR      Result Value Range   HM Pap smear done     Labs pending  Meds ordered this encounter  Medications  . Coenzyme Q10 (CO Q 10) 100 MG CAPS    Sig: Take 1 capsule by mouth daily.  . Arginine 500 MG CAPS    Sig: Take 1 capsule by mouth daily. L-Arginine  . vitamin B-12 (CYANOCOBALAMIN) 1000 MCG tablet    Sig: Take 1,000 mcg by mouth daily.  Chilton Si Tea, Camillia sinensis, ([employer]S SUPER GREEN TEA EXTRACT) 250 MG CAPS    Sig: Take 1 capsule by mouth daily. Takes 350 egcg bid   Agree with cutting the vytorin. Consideration to switch to Pravastatin.        Dr Woodroe Mode Recommendations  Diet and Exercise discussed with patient.  For nutrition information, I recommend books:  1).Eat to Live by Dr Monico Hoar. 2).Prevent and Reverse Heart Disease by Dr Suzzette Righter. 3) Dr Katherina Right Book:  Program to Reverse Diabetes  Exercise recommendations are:  If unable to walk, then the patient can exercise in a chair 3 times a day. By flapping arms like a bird gently and raising legs outwards to the front.  If ambulatory, the patient can go for walks for 30 minutes 3 times a week. Then increase the intensity and duration as tolerated.  Goal is to try to attain exercise  frequency to 5 times a week.  If applicable: Best to perform resistance exercises (machines or weights) 2 days a week and cardio type exercises 3 days per week.  Return in about 4 months (around 06/24/2013) for Recheck medical problems.  Rylin Saez P. Modesto Charon, M.D.

## 2013-02-21 NOTE — Progress Notes (Signed)
PATIENT CAME IN FOR LABS ONLY 

## 2013-02-21 NOTE — Patient Instructions (Addendum)
      Dr Makayleigh Poliquin's Recommendations  Diet and Exercise discussed with patient.  For nutrition information, I recommend books:  1).Eat to Live by Dr Joel Fuhrman. 2).Prevent and Reverse Heart Disease by Dr Caldwell Esselstyn. 3) Dr Neal Barnard's Book:  Program to Reverse Diabetes  Exercise recommendations are:  If unable to walk, then the patient can exercise in a chair 3 times a day. By flapping arms like a bird gently and raising legs outwards to the front.  If ambulatory, the patient can go for walks for 30 minutes 3 times a week. Then increase the intensity and duration as tolerated.  Goal is to try to attain exercise frequency to 5 times a week.  If applicable: Best to perform resistance exercises (machines or weights) 2 days a week and cardio type exercises 3 days per week.  

## 2013-02-22 ENCOUNTER — Other Ambulatory Visit: Payer: Self-pay | Admitting: Family Medicine

## 2013-02-22 DIAGNOSIS — E785 Hyperlipidemia, unspecified: Secondary | ICD-10-CM

## 2013-02-22 LAB — NMR LIPOPROFILE WITH LIPIDS
Cholesterol, Total: 171 mg/dL (ref <200)
HDL Particle Number: 39.3 umol/L (ref >=30.5)
HDL Size: 9.3 nm (ref >=9.2)
HDL-C: 56 mg/dL (ref >=40)
LDL (calc): 85 mg/dL (ref <100)
LDL Particle Number: 1191 nmol/L — ABNORMAL HIGH (ref <1000)
LDL Size: 20.7 nm (ref >20.5)
LP-IR Score: <25 (ref <=45)
Large HDL-P: 8 umol/L (ref >=4.8)
Large VLDL-P: <0.8 nmol/L (ref <=2.7)
Small LDL Particle Number: 547 nmol/L — ABNORMAL HIGH (ref <=527)
Triglycerides: 148 mg/dL (ref <150)
VLDL Size: 40.7 nm (ref <=46.6)

## 2013-02-22 MED ORDER — PRAVASTATIN SODIUM 20 MG PO TABS: 20.0000 mg | ORAL_TABLET | Freq: Every day | ORAL | Status: DC

## 2013-03-20 ENCOUNTER — Telehealth: Payer: Self-pay | Admitting: Family Medicine

## 2013-03-20 NOTE — Telephone Encounter (Signed)
Wants Christine Sandoval to call her late tues afternoon about something they discussed at last OV

## 2013-03-21 NOTE — Telephone Encounter (Signed)
Spoke with pt states she got a bill from [employer] and they will not pay for routine labs   Call trarsferred to [place] in billing

## 2013-04-03 ENCOUNTER — Encounter: Payer: Self-pay | Admitting: *Deleted

## 2013-04-07 ENCOUNTER — Encounter: Payer: Self-pay | Admitting: Cardiovascular Disease

## 2013-04-07 ENCOUNTER — Ambulatory Visit (INDEPENDENT_AMBULATORY_CARE_PROVIDER_SITE_OTHER): Payer: Medicare Other | Admitting: Cardiovascular Disease

## 2013-04-07 VITALS — BP 126/84 | HR 69 | Resp 16 | Ht 63.0 in | Wt 125.6 lb

## 2013-04-07 DIAGNOSIS — J449 Chronic obstructive pulmonary disease, unspecified: Secondary | ICD-10-CM

## 2013-04-07 DIAGNOSIS — I2510 Atherosclerotic heart disease of native coronary artery without angina pectoris: Secondary | ICD-10-CM

## 2013-04-07 DIAGNOSIS — E785 Hyperlipidemia, unspecified: Secondary | ICD-10-CM

## 2013-04-07 DIAGNOSIS — J4489 Other specified chronic obstructive pulmonary disease: Secondary | ICD-10-CM

## 2013-04-07 DIAGNOSIS — I201 Angina pectoris with documented spasm: Secondary | ICD-10-CM

## 2013-04-07 NOTE — Patient Instructions (Addendum)
Your physician recommends that you schedule a follow-up appointment in: 1 year  

## 2013-04-07 NOTE — Progress Notes (Signed)
1 

## 2013-04-08 ENCOUNTER — Encounter: Payer: Self-pay | Admitting: Cardiovascular Disease

## 2013-04-08 NOTE — Progress Notes (Signed)
Patient ID: Christine Sandoval, female   DOB: [birth year], 72 y.o.   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 The first episode occurred in the 1980s. The second episode occurred in 2010. Angiography after the episode in 2010 did not show any evidence of coronary stenoses The suspicion is that this is related to coronary vasospasm, although she quit smoking in 1995. She has really implemented a number of very healthful lifestyle changes and is lean and very active.  She is asymptomatic. She has not required sublingual nitroglycerin. Her most recent lipid profile showed very satisfactory cholesterol components but her LDL particle number was a little high at 1191 and she had small dense LDL particles. It appears that at the time she had decided to reduce her dose of Vytorin to half of the the 10/20 mg tablet. She has subsequently switched to pravastatin, which she tolerates better and which is much less expensive. He is only taking the 20 mg dose of pravastatin to  Allergies  Allergen Reactions  . Codeine   . Hydrochlorothiazide     Current Outpatient Prescriptions  Medication Sig Dispense Refill  . Arginine 500 MG CAPS Take 1 capsule by mouth daily. L-Arginine      . Ascorbic Acid (VITAMIN C) 1000 MG tablet Take 2 tablet a day      . aspirin 81 MG tablet Take 162 mg by mouth daily.       . B Complex Vitamins (B-COMPLEX/B-12 PO) Take 1 capsule by mouth daily.        . Calcium Citrate-Vitamin D (CALCIUM CITRATE + D PO) Take 500 mg by mouth.      . Cholecalciferol (VITAMIN D) 1000 UNITS capsule Take 4,000 Units by mouth daily.        . Coenzyme Q10 (CO Q 10) 100 MG CAPS Take 1 capsule by mouth daily.      . fish oil-omega-3 fatty acids 1000 MG capsule  Take 1 g by mouth daily.        Chilton Si Tea, Camillia sinensis, ([employer] SUPER GREEN TEA EXTRACT) 250 MG CAPS Take 1 capsule by mouth daily. Takes 350 egcg bid      . Multiple Vitamin (MULTIVITAMIN) capsule Take 1 capsule by mouth daily.        . pravastatin (PRAVACHOL) 20 MG tablet Take 1 tablet (20 mg total) by mouth daily.  30 tablet  5  . vitamin B-12 (CYANOCOBALAMIN) 1000 MCG tablet Take 1,000 mcg by mouth daily.       Current Facility-Administered Medications  Medication Dose Route Frequency Provider Last Rate Last Dose  . 0.9 %  sodium chloride infusion  500 mL Intravenous Continuous Hilarie Fredrickson, MD        Past Medical History  Diagnosis Date  . CAD (coronary artery disease)   . OAB (overactive bladder)   . Hyperlipidemia   . Mitral valve prolapse     mild  . COPD (chronic obstructive pulmonary disease)   . Osteoporosis   . STEMI (ST elevation myocardial infarction) 2011    x 2 from coronary vasospasm  . History of coronary vasospasm   . Venous insufficiency     Past Surgical History  Procedure Laterality Date  . Colonoscopy      history of polyps  .  Melanoma excision    . Breast lumpectomy      benign  . Dilation and curettage of uterus       X 2  . Cataract extraction      bilateral eyes  . Cardiac catheterization  03/07/2009    Normal coronaries  . Ablation saphenous vein w/ rfa  07/05/2010    left  . US echocardiography  05/23/2008    MVP,mild mitral annular ca+,mild MR,AOV mildlly sclerotic  . Nm myocar perf wall motion  05/23/2008    No significant ischemia    Family History  Problem Relation Age of Onset  . Colon cancer Maternal Aunt   . Colon cancer Maternal Uncle   . Alcohol abuse Mother   . Heart disease Sister 28    CHF  . Heart failure Father     History   Social History  . Marital Status: Married    Spouse Name: N/A    Number of Children: N/A  . Years of Education: N/A   Occupational History  . Not on file.   Social History Main Topics   . Smoking status: Former Smoker    Types: Cigarettes    Quit date: 08/03/1988  . Smokeless tobacco: Never Used  . Alcohol Use: No  . Drug Use: No  . Sexual Activity: Not on file   Other Topics Concern  . Not on file   Social History Narrative  . No narrative on file    Review of systems: The patient specifically denies any chest pain at rest or with exertion, dyspnea at rest or with exertion, orthopnea, paroxysmal nocturnal dyspnea, syncope, palpitations, focal neurological deficits, intermittent claudication, lower extremity edema, unexplained weight gain, cough, hemoptysis or wheezing.  The patient also denies abdominal pain, nausea, vomiting, dysphagia, diarrhea, constipation, polyuria, polydipsia, dysuria, hematuria, frequency, urgency, abnormal bleeding or bruising, fever, chills, unexpected weight changes, mood swings, change in skin or hair texture, change in voice quality, auditory or visual problems, allergic reactions or rashes, new musculoskeletal complaints other than usual "aches and pains".   PHYSICAL EXAM BP 126/84  Pulse 69  Resp 16  Ht 5\' 3"  (1.6 m)  Wt 125 lb 9.6 oz (56.972 kg)  BMI 22.25 kg/m2  General: Alert, oriented x3, no distress Head: no evidence of trauma, PERRL, EOMI, no exophtalmos or lid lag, no myxedema, no xanthelasma; normal ears, nose and oropharynx Neck: normal jugular venous pulsations and no hepatojugular reflux; brisk carotid pulses without delay and no carotid bruits Chest: clear to auscultation, no signs of consolidation by percussion or palpation, normal fremitus, symmetrical and full respiratory excursions Cardiovascular: normal position and quality of the apical impulse, regular rhythm, normal first and second heart sounds, no murmurs, rubs or gallops Abdomen: no tenderness or distention, no masses by palpation, no abnormal pulsatility or arterial bruits, normal bowel sounds, no hepatosplenomegaly Extremities: no clubbing, cyanosis or  edema; 2+ radial, ulnar and brachial pulses bilaterally; 2+ right femoral, posterior tibial and dorsalis pedis pulses; 2+ left femoral, posterior tibial and dorsalis pedis pulses; no subclavian or femoral bruits Neurological: grossly nonfocal   EKG: NSR, normal  BMET    Component Value Date/Time   NA 141 02/21/2013 1031   K 5.0 02/21/2013 1031   CL 106 02/21/2013 1031   CO2 28 02/21/2013 1031   GLUCOSE 97 02/21/2013 1031   BUN 11 02/21/2013 1031   CREATININE 0.97 02/21/2013 1031   CREATININE 0.77 03/08/2009 0555   CALCIUM 9.8 02/21/2013 1031   GFRNONAA >60  03/08/2009 0555   GFRAA  Value: >60        The eGFR has been calculated using the MDRD equation. This calculation has not been validated in all clinical situations. eGFR's persistently <60 mL/min signify possible Chronic Kidney Disease. 03/08/2009 0555     ASSESSMENT AND PLAN Coronary vasospasm Presumed cause of 2 previous episodes of non-ST segment elevation myocardial infarction. No evidence of luminal coronary obstruction by coronary angiography in 2010. She has stopped smoking. Her remaining risk factor is hyperlipidemia. We discussed the fact that the exact causes of coronary vasospasm are not always clear, but that smoking and metabolic abnormality such as diabetes and hypercholesterolemia may be the most important treatable risk factors. She does not have migraine headaches or Raynaud's syndrome.  HLD (hyperlipidemia) She has really made a lot of very positive changes in her lifestyle. She quit smoking and is very lean and eats a very healthy diet. I am still concerned that her current lipid lowering regimen is not sufficient. While taking the Vytorin 10/20 mg half tablet a day her LDL particle number was still high at 1191. The current dose of pravastatin that she is taking is roughly equivalent to the simvastatin in the Vytorin, but she is no longer receiving the second ingredient, ezetimibe. We plan to recheck her lipid profile, but I would  not be surprised if we need to increase the dose of pravastatin. I see no problem with any of the supplements that she is taking. I don't think the dose of fish oil that she is receiving is so high that it would cause increases in the LDL.  COPD (chronic obstructive pulmonary disease)    Orders Placed This Encounter  Procedures  . EKG 12-Lead   Meds ordered this encounter  Medications  . Calcium Citrate-Vitamin D (CALCIUM CITRATE + D PO)    Sig: Take 500 mg by mouth.    Junious Silk, MD, [facility] [phone] office [phone] pager

## 2013-04-08 NOTE — Assessment & Plan Note (Signed)
She has really made a lot of very positive changes in her lifestyle. She quit smoking and is very lean and eats a very healthy diet. I am still concerned that her current lipid lowering regimen is not sufficient. While taking the Vytorin 10/20 mg half tablet a day her LDL particle number was still high at 1191. The current dose of pravastatin that she is taking is roughly equivalent to the simvastatin in the Vytorin, but she is no longer receiving the second ingredient, ezetimibe. We plan to recheck her lipid profile, but I would not be surprised if we need to increase the dose of pravastatin. I see no problem with any of the supplements that she is taking. I don't think the dose of fish oil that she is receiving is so high that it would cause increases in the LDL.

## 2013-04-08 NOTE — Assessment & Plan Note (Signed)
Presumed cause of 2 previous episodes of non-ST segment elevation myocardial infarction. No evidence of luminal coronary obstruction by coronary angiography in 2010. She has stopped smoking. Her remaining risk factor is hyperlipidemia. We discussed the fact that the exact causes of coronary vasospasm are not always clear, but that smoking and metabolic abnormality such as diabetes and hypercholesterolemia may be the most important treatable risk factors. She does not have migraine headaches or Raynaud's syndrome.

## 2013-04-14 ENCOUNTER — Ambulatory Visit: Payer: Medicare Other | Admitting: *Deleted

## 2013-04-14 DIAGNOSIS — IMO0001 Reserved for inherently not codable concepts without codable children: Secondary | ICD-10-CM

## 2013-04-14 NOTE — Progress Notes (Signed)
Patient noticed a pustule type bump on her hard palate about 3 days ago.   She used warm salt water gargles yesterday and the area opened and seemed to spread.  On observation she appears to have a flat ulceration on left side of hard palate.  Discussed with Philomena Doheny, FNP.  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  Patient can use glyoxide OTC tonight and call the office in the morning if symptoms worsen.  Literature on canker sores given.

## 2013-04-14 NOTE — Progress Notes (Signed)
  Subjective:    Patient ID: Christine Sandoval, female    DOB: July 11, 1941, 72 y.o.   MRN: [id number]  HPI    Review of Systems     Objective:   Physical Exam        Assessment & Plan:

## 2013-04-14 NOTE — Patient Instructions (Signed)
Canker Sores   Canker sores are painful, open sores on the inside of the mouth and cheek. They may be white or yellow. The sores usually heal in 1 to 2 weeks. Women are more likely than men to have recurrent canker sores.  CAUSES  The cause of canker sores is not well understood. More than one cause is likely. Canker sores do not appear to be caused by certain types of germs (viruses or bacteria). Canker sores may be caused by:   An allergic reaction to certain foods.   Digestive problems.   Not having enough vitamin B12, folic acid, and iron.   Female sex hormones. Sores may come only during certain phases of a menstrual cycle. Often, there is improvement during pregnancy.   Genetics. Some people seem to inherit canker sore problems.  Emotional stress and injuries to the mouth may trigger outbreaks, but not cause them.   DIAGNOSIS  Canker sores are diagnosed by exam.   TREATMENT   Patients who have frequent bouts of canker sores may have cultures taken of the sores, blood tests, or allergy tests. This helps determine if their sores are caused by a poor diet, an allergy, or some other preventable or treatable disease.   Vitamins may prevent recurrences or reduce the severity of canker sores in people with poor nutrition.   Numbing ointments can relieve pain. These are available in drug stores without a prescription.   Anti-inflammatory steroid mouth rinses or gels may be prescribed by your caregiver for severe sores.   Oral steroids may be prescribed if you have severe, recurrent canker sores. These strong medicines can cause many side effects and should be used only under the close direction of a dentist or physician.   Mouth rinses containing the antibiotic medicine may be prescribed. They may lessen symptoms and speed healing.  Healing usually happens in about 1 or 2 weeks with or without treatment. Certain antibiotic mouth rinses given to pregnant women and young children can permanently stain teeth.  Talk to your caregiver about your treatment.  HOME CARE INSTRUCTIONS    Avoid foods that cause canker sores for you.   Avoid citrus juices, spicy or salty foods, and coffee until the sores are healed.   Use a soft-bristled toothbrush.   Chew your food carefully to avoid biting your cheek.   Apply topical numbing medicine to the sore to help relieve pain.   Apply a thin paste of baking soda and water to the sore to help heal the sore.   Only use mouth rinses or medicines for pain or discomfort as directed by your caregiver.  SEEK MEDICAL CARE IF:    Your symptoms are not better in 1 week.   Your sores are still present after 2 weeks.   Your sores are very painful.   You have trouble breathing or swallowing.   Your sores come back frequently.  Document Released: 11/14/2010 Document Revised: 10/12/2011 Document Reviewed: 11/14/2010  [employer] Patient Information 2014 [employer].

## 2013-07-17 ENCOUNTER — Encounter: Payer: Self-pay | Admitting: Cardiovascular Disease

## 2013-08-04 DIAGNOSIS — Z961 Presence of intraocular lens: Secondary | ICD-10-CM | POA: Diagnosis not present

## 2013-08-04 DIAGNOSIS — H26499 Other secondary cataract, unspecified eye: Secondary | ICD-10-CM | POA: Diagnosis not present

## 2013-08-04 DIAGNOSIS — H43819 Vitreous degeneration, unspecified eye: Secondary | ICD-10-CM | POA: Diagnosis not present

## 2013-08-11 ENCOUNTER — Ambulatory Visit: Payer: Medicare Other | Admitting: Family Medicine

## 2013-08-15 ENCOUNTER — Other Ambulatory Visit: Payer: Self-pay | Admitting: Family Medicine

## 2013-08-24 ENCOUNTER — Encounter: Payer: Self-pay | Admitting: Family Medicine

## 2013-08-24 ENCOUNTER — Ambulatory Visit (INDEPENDENT_AMBULATORY_CARE_PROVIDER_SITE_OTHER): Payer: Medicare Other | Admitting: Family Medicine

## 2013-08-24 VITALS — BP 127/78 | HR 80 | Temp 97.2°F | Ht 63.0 in | Wt 126.0 lb

## 2013-08-24 DIAGNOSIS — I2510 Atherosclerotic heart disease of native coronary artery without angina pectoris: Secondary | ICD-10-CM

## 2013-08-24 DIAGNOSIS — R259 Unspecified abnormal involuntary movements: Secondary | ICD-10-CM | POA: Diagnosis not present

## 2013-08-24 DIAGNOSIS — M810 Age-related osteoporosis without current pathological fracture: Secondary | ICD-10-CM | POA: Diagnosis not present

## 2013-08-24 DIAGNOSIS — J449 Chronic obstructive pulmonary disease, unspecified: Secondary | ICD-10-CM

## 2013-08-24 DIAGNOSIS — IMO0001 Reserved for inherently not codable concepts without codable children: Secondary | ICD-10-CM | POA: Diagnosis not present

## 2013-08-24 DIAGNOSIS — E785 Hyperlipidemia, unspecified: Secondary | ICD-10-CM | POA: Diagnosis not present

## 2013-08-24 DIAGNOSIS — R252 Cramp and spasm: Secondary | ICD-10-CM

## 2013-08-24 DIAGNOSIS — M791 Myalgia, unspecified site: Secondary | ICD-10-CM

## 2013-08-24 NOTE — Patient Instructions (Signed)
Stop the statin for 2 weeks and let me know how you did.       Dr Paula Libra Recommendations  For nutrition information, I recommend books:  1).Eat to Live by Dr Excell Seltzer. 2).Prevent and Reverse Heart Disease by Dr Karl Luke. 3) Dr Janene Harvey Book:  Program to Reverse Diabetes  Exercise recommendations are:  If unable to walk, then the patient can exercise in a chair 3 times a day. By flapping arms like a bird gently and raising legs outwards to the front.  If ambulatory, the patient can go for walks for 30 minutes 3 times a week. Then increase the intensity and duration as tolerated.  Goal is to try to attain exercise frequency to 5 times a week.  If applicable: Best to perform resistance exercises (machines or weights) 2 days a week and cardio type exercises 3 days per week.

## 2013-08-24 NOTE — Progress Notes (Signed)
Patient ID: Christine Sandoval, female   DOB: [birth year], 73 y.o.   MRN: [id number] SUBJECTIVE: CC: Chief Complaint  Patient presents with  . Follow-up    c/o muscle weakness. c/o lump left inner thigh    HPI: Patient is here for follow up of hyperlipidemia: denies Headache;denies Chest Pain;denies weakness;denies Shortness of Breath and orthopnea;denies Visual changes;denies palpitations;denies cough;denies pedal edema;denies symptoms of TIA or stroke;deniesClaudication symptoms. admits to Compliance with medications; denies Problems with medications.  Legs jerking, and feels muscle tone has changed. And her finger nails are thin and outside of her shins and hip hurts and  Weakness of hands.memory impairment.  Outside cannot breathe thinks it is something in the air. Fine indoors.  Past Medical History  Diagnosis Date  . CAD (coronary artery disease)   . OAB (overactive bladder)   . Hyperlipidemia   . Mitral valve prolapse     mild  . COPD (chronic obstructive pulmonary disease)   . Osteoporosis   . STEMI (ST elevation myocardial infarction) 2011    x 2 from coronary vasospasm  . History of coronary vasospasm   . Venous insufficiency    Past Surgical History  Procedure Laterality Date  . Colonoscopy      history of polyps  . Melanoma excision    . Breast lumpectomy      benign  . Dilation and curettage of uterus       X 2  . Cataract extraction      bilateral eyes  . Cardiac catheterization  03/07/2009    Normal coronaries  . Ablation saphenous vein w/ rfa  07/05/2010    left  . US echocardiography  05/23/2008    MVP,mild mitral annular ca+,mild MR,AOV mildlly sclerotic  . Nm myocar perf wall motion  05/23/2008    No significant ischemia   History   Social History  . Marital Status: Married    Spouse Name: N/A    Number of Children: N/A  . Years of Education: N/A   Occupational History  . Not on file.   Social History Main Topics  . Smoking status: Former  Smoker    Types: Cigarettes    Quit date: 08/03/1988  . Smokeless tobacco: Never Used  . Alcohol Use: No  . Drug Use: No  . Sexual Activity: Not on file   Other Topics Concern  . Not on file   Social History Narrative  . No narrative on file   Family History  Problem Relation Age of Onset  . Colon cancer Maternal Aunt   . Colon cancer Maternal Uncle   . Alcohol abuse Mother   . Heart disease Sister 28    CHF  . Heart failure Father    Current Outpatient Prescriptions on File Prior to Visit  Medication Sig Dispense Refill  . Ascorbic Acid (VITAMIN C) 1000 MG tablet Take 2 tablet a day      . aspirin 81 MG tablet Take 162 mg by mouth daily.       . B Complex Vitamins (B-COMPLEX/B-12 PO) Take 1 capsule by mouth daily.        . Calcium Citrate-Vitamin D (CALCIUM CITRATE + D PO) Take 500 mg by mouth.      . Cholecalciferol (VITAMIN D) 1000 UNITS capsule Take 4,000 Units by mouth daily.        . Coenzyme Q10 (CO Q 10) 100 MG CAPS Take 1 capsule by mouth daily.      . fish oil-omega-3  fatty acids 1000 MG capsule Take 1 g by mouth daily.        Nyoka Cowden Tea, Camillia sinensis, ([employer] SUPER GREEN TEA EXTRACT) 250 MG CAPS Take 1 capsule by mouth daily. Takes 350 egcg bid      . Multiple Vitamin (MULTIVITAMIN) capsule Take 1 capsule by mouth daily.        . vitamin B-12 (CYANOCOBALAMIN) 1000 MCG tablet Take 1,000 mcg by mouth daily.      . Arginine 500 MG CAPS Take 1 capsule by mouth daily. L-Arginine       Current Facility-Administered Medications on File Prior to Visit  Medication Dose Route Frequency Provider Last Rate Last Dose  . 0.9 %  sodium chloride infusion  500 mL Intravenous Continuous Irene Shipper, MD       Allergies  Allergen Reactions  . Codeine   . Hydrochlorothiazide    Immunization History  Administered Date(s) Administered  . Influenza Whole 05/04/2007, 05/03/2009  . Influenza, High Dose Seasonal PF 05/16/2013  . Pneumococcal Polysaccharide-23 07/01/2009  . Td  03/21/2008   Prior to Admission medications   Medication Sig Start Date End Date Taking? Authorizing Provider  Arginine 500 MG CAPS Take 1 capsule by mouth daily. L-Arginine    Historical Provider, MD  Ascorbic Acid (VITAMIN C) 1000 MG tablet Take 2 tablet a day    Historical Provider, MD  aspirin 81 MG tablet Take 162 mg by mouth daily.     Historical Provider, MD  B Complex Vitamins (B-COMPLEX/B-12 PO) Take 1 capsule by mouth daily.      Historical Provider, MD  Calcium Citrate-Vitamin D (CALCIUM CITRATE + D PO) Take 500 mg by mouth.    Historical Provider, MD  Cholecalciferol (VITAMIN D) 1000 UNITS capsule Take 4,000 Units by mouth daily.      Historical Provider, MD  Coenzyme Q10 (CO Q 10) 100 MG CAPS Take 1 capsule by mouth daily.    Historical Provider, MD  fish oil-omega-3 fatty acids 1000 MG capsule Take 1 g by mouth daily.      Historical Provider, MD  Nyoka Cowden Tea, Camillia sinensis, ([employer] SUPER GREEN TEA EXTRACT) 250 MG CAPS Take 1 capsule by mouth daily. Takes 350 egcg bid    Historical Provider, MD  Multiple Vitamin (MULTIVITAMIN) capsule Take 1 capsule by mouth daily.      Historical Provider, MD  pravastatin (PRAVACHOL) 20 MG tablet TAKE ONE TABLET BY MOUTH ONCE DAILY 08/15/13   Vernie Shanks, MD  vitamin B-12 (CYANOCOBALAMIN) 1000 MCG tablet Take 1,000 mcg by mouth daily.    Historical Provider, MD     ROS: As above in the HPI. All other systems are stable or negative.  OBJECTIVE: APPEARANCE:  Patient in no acute distress.The patient appeared well nourished and normally developed. Acyanotic. Waist: VITAL SIGNS:BP 127/78  Pulse 80  Temp(Src) 97.2 F (36.2 C) (Oral)  Ht '5\' 3"'  (1.6 m)  Wt 126 lb (57.153 kg)  BMI 22.33 kg/m2 WF  SKIN: warm and  Dry without overt rashes, tattoos and scars  HEAD and Neck: without JVD, Head and scalp: normal Eyes:No scleral icterus. Fundi normal, eye movements normal. Ears: Auricle normal, canal normal, Tympanic membranes normal,  insufflation normal. Nose: normal Throat: normal Neck & thyroid: normal  CHEST & LUNGS: Chest wall: normal Lungs: Clear  CVS: Reveals the PMI to be normally located. Regular rhythm, First and Second Heart sounds are normal,  absence of murmurs, rubs or gallops. Peripheral vasculature: Radial pulses:  normal Dorsal pedis pulses: normal Posterior pulses: normal  ABDOMEN:  Appearance: normal Benign, no organomegaly, no masses, no Abdominal Aortic enlargement. No Guarding , no rebound. No Bruits. Bowel sounds: normal  RECTAL: N/A GU: N/A  EXTREMETIES: nonedematous.  MUSCULOSKELETAL:  Spine: normal Joints: intact Mild generalized weakness of lower extremities.  NEUROLOGIC: oriented to time,place and person; nonfocal. Strength is normal Sensory is normal Reflexes are normal Cranial Nerves are normal.   ASSESSMENT: Osteoporosis, unspecified - Plan: Lipid panel  CAD (coronary artery disease) - Plan: Lipid panel  COPD (chronic obstructive pulmonary disease) - Plan: Lipid panel  HLD (hyperlipidemia) - Plan: CMP14+EGFR, Lipid panel, CANCELED: NMR, lipoprofile  Muscular aches - Plan: CK, Aldolase, Lipid panel  Spasm - Plan: CK, Aldolase, Lipid panel Suspect intolerance to statin.   PLAN: Stop the statin for 2 weeks and let me know how you did.       Dr Paula Libra Recommendations  For nutrition information, I recommend books:  1).Eat to Live by Dr Excell Seltzer. 2).Prevent and Reverse Heart Disease by Dr Karl Luke. 3) Dr Janene Harvey Book:  Program to Reverse Diabetes  Exercise recommendations are:  If unable to walk, then the patient can exercise in a chair 3 times a day. By flapping arms like a bird gently and raising legs outwards to the front.  If ambulatory, the patient can go for walks for 30 minutes 3 times a week. Then increase the intensity and duration as tolerated.  Goal is to try to attain exercise frequency to 5 times a week.  If  applicable: Best to perform resistance exercises (machines or weights) 2 days a week and cardio type exercises 3 days per week.  Orders Placed This Encounter  Procedures  . CMP14+EGFR  . CK  . Aldolase  . Lipid panel   Meds ordered this encounter  Medications  . folic acid (FOLVITE) 867 MCG tablet    Sig: Take 400 mcg by mouth daily.   Medications Discontinued During This Encounter  Medication Reason  . pravastatin (PRAVACHOL) 20 MG tablet Side effect (s)   Return in about 3 months (around 11/22/2013) for Recheck medical problems.  Laren Orama P. Jacelyn Grip, M.D.

## 2013-08-27 NOTE — Progress Notes (Signed)
Quick Note:  Call Patient Labs that are abnormal:  Lipids are high  The rest are at goal  Recommendations: She is to call to let us know how she feels off of Pravastatin for 2 weeks. Then we will need to use something for her Lipids/cholesterol and triglycerides. Plant based Diet recommended.   ______

## 2013-08-28 LAB — LIPID PANEL
Chol/HDL Ratio: 3.1 ratio units (ref 0.0–4.4)
Cholesterol, Total: 241 mg/dL — ABNORMAL HIGH (ref 100–199)
HDL: 78 mg/dL (ref >39)
LDL Calculated: 121 mg/dL — ABNORMAL HIGH (ref 0–99)
Triglycerides: 211 mg/dL — ABNORMAL HIGH (ref 0–149)
VLDL Cholesterol Cal: 42 mg/dL — ABNORMAL HIGH (ref 5–40)

## 2013-08-28 LAB — CMP14+EGFR
ALT: 32 IU/L (ref 0–32)
AST: 38 IU/L (ref 0–40)
Albumin/Globulin Ratio: 2.6 — ABNORMAL HIGH (ref 1.1–2.5)
Albumin: 4.7 g/dL (ref 3.5–4.8)
Alkaline Phosphatase: 70 IU/L (ref 39–117)
BUN/Creatinine Ratio: 11 (ref 11–26)
BUN: 10 mg/dL (ref 8–27)
CO2: 25 mmol/L (ref 18–29)
Calcium: 10 mg/dL (ref 8.7–10.3)
Chloride: 100 mmol/L (ref 97–108)
Creatinine, Ser: 0.92 mg/dL (ref 0.57–1.00)
GFR calc Af Amer: 73 mL/min/{1.73_m2} (ref >59)
GFR calc non Af Amer: 63 mL/min/{1.73_m2} (ref >59)
Globulin, Total: 1.8 g/dL (ref 1.5–4.5)
Glucose: 99 mg/dL (ref 65–99)
Potassium: 5 mmol/L (ref 3.5–5.2)
Sodium: 142 mmol/L (ref 134–144)
Total Bilirubin: 0.3 mg/dL (ref 0.0–1.2)
Total Protein: 6.5 g/dL (ref 6.0–8.5)

## 2013-08-28 LAB — CK: Total CK: 86 U/L (ref 24–173)

## 2013-08-28 LAB — NMR, LIPOPROFILE

## 2013-08-28 LAB — ALDOLASE: Aldolase: 5.2 U/L (ref 3.3–10.3)

## 2013-08-30 ENCOUNTER — Other Ambulatory Visit: Payer: Self-pay | Admitting: Family Medicine

## 2013-08-30 DIAGNOSIS — E785 Hyperlipidemia, unspecified: Secondary | ICD-10-CM

## 2013-10-06 ENCOUNTER — Other Ambulatory Visit: Payer: Self-pay | Admitting: *Deleted

## 2013-10-06 MED ORDER — NITROGLYCERIN 0.4 MG SL SUBL: 0.4000 mg | SUBLINGUAL_TABLET | SUBLINGUAL | Status: DC | PRN

## 2013-10-06 NOTE — Telephone Encounter (Signed)
Rx was sent to pharmacy electronically. 

## 2013-11-14 ENCOUNTER — Telehealth: Payer: Self-pay | Admitting: Family Medicine

## 2013-11-14 NOTE — Telephone Encounter (Signed)
Pt aware appt 11-27-13 at 3pm

## 2013-11-27 ENCOUNTER — Encounter: Payer: Self-pay | Admitting: Family Medicine

## 2013-11-27 ENCOUNTER — Ambulatory Visit (INDEPENDENT_AMBULATORY_CARE_PROVIDER_SITE_OTHER): Payer: Medicare Other | Admitting: Family Medicine

## 2013-11-27 VITALS — BP 127/78 | HR 85 | Temp 98.0°F | Ht 63.0 in | Wt 126.4 lb

## 2013-11-27 DIAGNOSIS — E785 Hyperlipidemia, unspecified: Secondary | ICD-10-CM | POA: Diagnosis not present

## 2013-11-27 DIAGNOSIS — J449 Chronic obstructive pulmonary disease, unspecified: Secondary | ICD-10-CM | POA: Diagnosis not present

## 2013-11-27 DIAGNOSIS — I201 Angina pectoris with documented spasm: Secondary | ICD-10-CM

## 2013-11-27 DIAGNOSIS — Z789 Other specified health status: Secondary | ICD-10-CM

## 2013-11-27 DIAGNOSIS — M810 Age-related osteoporosis without current pathological fracture: Secondary | ICD-10-CM

## 2013-11-27 DIAGNOSIS — I2510 Atherosclerotic heart disease of native coronary artery without angina pectoris: Secondary | ICD-10-CM | POA: Diagnosis not present

## 2013-11-27 DIAGNOSIS — Z136 Encounter for screening for cardiovascular disorders: Secondary | ICD-10-CM

## 2013-11-27 NOTE — Progress Notes (Signed)
Patient ID: Christine Sandoval, female   DOB: 07-Jan-1941, 73 y.o.   MRN: [id number] SUBJECTIVE: CC: Chief Complaint  Patient presents with  . Follow-up    follow up 3 month chronic problems  off of pravastatain    HPI: Patient is here for follow up of hyperlipidemia/CAD/COPD/Osteoporosis: denies Headache;denies Chest Pain;denies weakness;denies Shortness of Breath and orthopnea;denies Visual changes;denies palpitations;denies cough;denies pedal edema;denies symptoms of TIA or stroke;deniesClaudication symptoms. admits to Compliance with medications; denies Problems with medications. Could not tolerate the pravastatin. On red yeast rice. Came to get a final check up with Dr Jacelyn Grip and have labs ordered.  Also, years ago was not sure but [facility] mentioned about looking at a AAA. Never had it rechecked.    Past Medical History  Diagnosis Date  . CAD (coronary artery disease)   . OAB (overactive bladder)   . Hyperlipidemia   . Mitral valve prolapse     mild  . COPD (chronic obstructive pulmonary disease)   . Osteoporosis   . STEMI (ST elevation myocardial infarction) 2011    x 2 from coronary vasospasm  . History of coronary vasospasm   . Venous insufficiency    Past Surgical History  Procedure Laterality Date  . Colonoscopy      history of polyps  . Melanoma excision    . Breast lumpectomy      benign  . Dilation and curettage of uterus       X 2  . Cataract extraction      bilateral eyes  . Cardiac catheterization  03/07/2009    Normal coronaries  . Ablation saphenous vein w/ rfa  07/05/2010    left  . US echocardiography  05/23/2008    MVP,mild mitral annular ca+,mild MR,AOV mildlly sclerotic  . Nm myocar perf wall motion  05/23/2008    No significant ischemia   History   Social History  . Marital Status: Married    Spouse Name: N/A    Number of Children: N/A  . Years of Education: N/A   Occupational History  . Not on file.   Social History Main Topics  .  Smoking status: Former Smoker    Types: Cigarettes    Quit date: 08/03/1988  . Smokeless tobacco: Never Used  . Alcohol Use: No  . Drug Use: No  . Sexual Activity: Not on file   Other Topics Concern  . Not on file   Social History Narrative  . No narrative on file   Family History  Problem Relation Age of Onset  . Colon cancer Maternal Aunt   . Colon cancer Maternal Uncle   . Alcohol abuse Mother   . Heart disease Sister 79    CHF  . Heart failure Father    Current Outpatient Prescriptions on File Prior to Visit  Medication Sig Dispense Refill  . Arginine 500 MG CAPS Take 1 capsule by mouth daily. L-Arginine      . Ascorbic Acid (VITAMIN C) 1000 MG tablet Take 2 tablet a day      . aspirin 81 MG tablet Take 162 mg by mouth daily.       . B Complex Vitamins (B-COMPLEX/B-12 PO) Take 1 capsule by mouth daily.        . Calcium Citrate-Vitamin D (CALCIUM CITRATE + D PO) Take 500 mg by mouth.      . Cholecalciferol (VITAMIN D) 1000 UNITS capsule Take 4,000 Units by mouth daily.        Marland Kitchen  Coenzyme Q10 (CO Q 10) 100 MG CAPS Take 1 capsule by mouth daily.      . fish oil-omega-3 fatty acids 1000 MG capsule Take 1 g by mouth daily.        . folic acid (FOLVITE) 470 MCG tablet Take 400 mcg by mouth daily.      Nyoka Cowden Tea, Camillia sinensis, ([employer] SUPER GREEN TEA EXTRACT) 250 MG CAPS Take 1 capsule by mouth daily. Takes 350 egcg bid      . Multiple Vitamin (MULTIVITAMIN) capsule Take 1 capsule by mouth daily.        . nitroGLYCERIN (NITROSTAT) 0.4 MG SL tablet Place 1 tablet (0.4 mg total) under the tongue every 5 (five) minutes as needed for chest pain.  25 tablet  3  . vitamin B-12 (CYANOCOBALAMIN) 1000 MCG tablet Take 1,000 mcg by mouth daily.       Current Facility-Administered Medications on File Prior to Visit  Medication Dose Route Frequency Provider Last Rate Last Dose  . 0.9 %  sodium chloride infusion  500 mL Intravenous Continuous Irene Shipper, MD       Allergies  Allergen  Reactions  . Codeine   . Hydrochlorothiazide    Immunization History  Administered Date(s) Administered  . Influenza Whole 05/04/2007, 05/03/2009  . Influenza, High Dose Seasonal PF 05/16/2013  . Pneumococcal Polysaccharide-23 07/01/2009  . Td 03/21/2008  . Zoster 08/03/2008   Prior to Admission medications   Medication Sig Start Date End Date Taking? Authorizing Provider  Arginine 500 MG CAPS Take 1 capsule by mouth daily. L-Arginine    Historical Provider, MD  Ascorbic Acid (VITAMIN C) 1000 MG tablet Take 2 tablet a day    Historical Provider, MD  aspirin 81 MG tablet Take 162 mg by mouth daily.     Historical Provider, MD  B Complex Vitamins (B-COMPLEX/B-12 PO) Take 1 capsule by mouth daily.      Historical Provider, MD  Calcium Citrate-Vitamin D (CALCIUM CITRATE + D PO) Take 500 mg by mouth.    Historical Provider, MD  Cholecalciferol (VITAMIN D) 1000 UNITS capsule Take 4,000 Units by mouth daily.      Historical Provider, MD  Coenzyme Q10 (CO Q 10) 100 MG CAPS Take 1 capsule by mouth daily.    Historical Provider, MD  fish oil-omega-3 fatty acids 1000 MG capsule Take 1 g by mouth daily.      Historical Provider, MD  folic acid (FOLVITE) 962 MCG tablet Take 400 mcg by mouth daily.    Historical Provider, MD  Nyoka Cowden Tea, Camillia sinensis, ([employer] SUPER GREEN TEA EXTRACT) 250 MG CAPS Take 1 capsule by mouth daily. Takes 350 egcg bid    Historical Provider, MD  Multiple Vitamin (MULTIVITAMIN) capsule Take 1 capsule by mouth daily.      Historical Provider, MD  nitroGLYCERIN (NITROSTAT) 0.4 MG SL tablet Place 1 tablet (0.4 mg total) under the tongue every 5 (five) minutes as needed for chest pain. 10/06/13   Mihai Croitoru, MD  vitamin B-12 (CYANOCOBALAMIN) 1000 MCG tablet Take 1,000 mcg by mouth daily.    Historical Provider, MD     ROS: As above in the HPI. All other systems are stable or negative.  OBJECTIVE: APPEARANCE:  Patient in no acute distress.The patient appeared well  nourished and normally developed. Acyanotic. Waist: VITAL SIGNS:BP 127/78  Pulse 85  Temp(Src) 98 F (36.7 C) (Oral)  Ht _0  (1.6 m)  Wt 126 lb 6.4 oz (57.335 kg)  BMI 22.40 kg/m2 WF  SKIN: warm and  Dry without overt rashes, tattoos and scars  HEAD and Neck: without JVD, Head and scalp: normal Eyes:No scleral icterus. Fundi normal, eye movements normal. Ears: Auricle normal, canal normal, Tympanic membranes normal, insufflation normal. Nose: normal Throat: normal Neck & thyroid: normal  CHEST & LUNGS: Chest wall: normal Lungs: Clear  CVS: Reveals the PMI to be normally located. Regular rhythm, First and Second Heart sounds are normal,  absence of murmurs, rubs or gallops. Peripheral vasculature: Radial pulses: normal Dorsal pedis pulses: normal Posterior pulses: normal  ABDOMEN:  Appearance: normal Benign, no organomegaly, no masses, no Abdominal Aortic enlargement. No Guarding , no rebound. No Bruits. Bowel sounds: normal  RECTAL: N/A GU: N/A  EXTREMETIES: nonedematous.  MUSCULOSKELETAL:  Spine: normal Joints: intact  NEUROLOGIC: oriented to time,place and person; nonfocal. Strength is normal Sensory is normal Reflexes are normal Cranial Nerves are normal. Results for orders placed in visit on 08/24/13  CMP14+EGFR      Result Value Ref Range   Glucose 99  65 - 99 mg/dL   BUN 10  8 - 27 mg/dL   Creatinine, Ser 0.92  0.57 - 1.00 mg/dL   GFR calc non Af Amer 63  >59 mL/min/1.73   GFR calc Af Amer 73  >59 mL/min/1.73   BUN/Creatinine Ratio 11  11 - 26   Sodium 142  134 - 144 mmol/L   Potassium 5.0  3.5 - 5.2 mmol/L   Chloride 100  97 - 108 mmol/L   CO2 25  18 - 29 mmol/L   Calcium 10.0  8.7 - 10.3 mg/dL   Total Protein 6.5  6.0 - 8.5 g/dL   Albumin 4.7  3.5 - 4.8 g/dL   Globulin, Total 1.8  1.5 - 4.5 g/dL   Albumin/Globulin Ratio 2.6 (*) 1.1 - 2.5   Total Bilirubin 0.3  0.0 - 1.2 mg/dL   Alkaline Phosphatase 70  39 - 117 IU/L   AST 38  0 - 40  IU/L   ALT 32  0 - 32 IU/L  CK      Result Value Ref Range   Total CK 86  24 - 173 U/L  ALDOLASE      Result Value Ref Range   Aldolase 5.2  3.3 - 10.3 U/L  NMR, LIPOPROFILE      Result Value Ref Range   LDL Particle Number CANCELED    LIPID PANEL      Result Value Ref Range   Cholesterol, Total 241 (*) 100 - 199 mg/dL   Triglycerides 211 (*) 0 - 149 mg/dL   HDL 78  >39 mg/dL   VLDL Cholesterol Cal 42 (*) 5 - 40 mg/dL   LDL Calculated 121 (*) 0 - 99 mg/dL   Chol/HDL Ratio 3.1  0.0 - 4.4 ratio units    ASSESSMENT:  CAD (coronary artery disease) - Plan: CMP14+EGFR  HLD (hyperlipidemia) - Plan: CMP14+EGFR, NMR, lipoprofile  Coronary vasospasm  COPD (chronic obstructive pulmonary disease)  Osteoporosis, unspecified  Vegetarianism - Plan: Vitamin B12  Screening for AAA (abdominal aortic aneurysm) - Plan: US Aorta  PLAN: Reviewed previous recommendations:       Dr Paula Libra Recommendations  For nutrition information, I recommend books:  1).Eat to Live by Dr Excell Seltzer. 2).Prevent and Reverse Heart Disease by Dr Karl Luke. 3) Dr Janene Harvey Book:  Program to Reverse Diabetes  Exercise recommendations are:  If unable to walk, then the patient can exercise  in a chair 3 times a day. By flapping arms like a bird gently and raising legs outwards to the front.  If ambulatory, the patient can go for walks for 30 minutes 3 times a week. Then increase the intensity and duration as tolerated.  Goal is to try to attain exercise frequency to 5 times a week.  If applicable: Best to perform resistance exercises (machines or weights) 2 days a week and cardio type exercises 3 days per week.  Orders Placed This Encounter  Procedures  . US Aorta    Was told once she may have a small aneurysm.    Standing Status: Future     Number of Occurrences:      Standing Expiration Date: 01/28/2015    Order Specific Question:  Reason for Exam (SYMPTOM  OR DIAGNOSIS  REQUIRED)    Answer:  questionable history of AAA    Order Specific Question:  Preferred imaging location?    Answer:  [facility]    Standing Status: Future     Number of Occurrences:      Standing Expiration Date: 11/28/2014    Order Specific Question:  Has the patient fasted?    Answer:  Yes  . NMR, lipoprofile    Standing Status: Future     Number of Occurrences:      Standing Expiration Date: 11/28/2014  . Vitamin B12    Standing Status: Future     Number of Occurrences:      Standing Expiration Date: 11/28/2014   No orders of the defined types were placed in this encounter.   There are no discontinued medications. Return in about 4 months (around 03/29/2014) for Recheck medical problems.  Treyvin Glidden P. Jacelyn Grip, M.D.

## 2013-11-28 ENCOUNTER — Other Ambulatory Visit (INDEPENDENT_AMBULATORY_CARE_PROVIDER_SITE_OTHER): Payer: Medicare Other

## 2013-11-28 DIAGNOSIS — I2510 Atherosclerotic heart disease of native coronary artery without angina pectoris: Secondary | ICD-10-CM | POA: Diagnosis not present

## 2013-11-28 DIAGNOSIS — E785 Hyperlipidemia, unspecified: Secondary | ICD-10-CM

## 2013-11-28 DIAGNOSIS — Z789 Other specified health status: Secondary | ICD-10-CM

## 2013-11-28 DIAGNOSIS — E559 Vitamin D deficiency, unspecified: Secondary | ICD-10-CM | POA: Diagnosis not present

## 2013-11-28 NOTE — Progress Notes (Signed)
Pt came in for labs only 

## 2013-11-29 LAB — VITAMIN B12: Vitamin B-12: >1999 pg/mL — ABNORMAL HIGH (ref 211–946)

## 2013-11-29 LAB — CMP14+EGFR
ALT: 17 IU/L (ref 0–32)
AST: 23 IU/L (ref 0–40)
Albumin/Globulin Ratio: 2.5 (ref 1.1–2.5)
Albumin: 4.5 g/dL (ref 3.5–4.8)
Alkaline Phosphatase: 69 IU/L (ref 39–117)
BUN/Creatinine Ratio: 16 (ref 11–26)
BUN: 15 mg/dL (ref 8–27)
CO2: 23 mmol/L (ref 18–29)
Calcium: 9.4 mg/dL (ref 8.7–10.3)
Chloride: 101 mmol/L (ref 97–108)
Creatinine, Ser: 0.91 mg/dL (ref 0.57–1.00)
GFR calc Af Amer: 74 mL/min/{1.73_m2} (ref >59)
GFR calc non Af Amer: 64 mL/min/{1.73_m2} (ref >59)
Globulin, Total: 1.8 g/dL (ref 1.5–4.5)
Glucose: 96 mg/dL (ref 65–99)
Potassium: 4.3 mmol/L (ref 3.5–5.2)
Sodium: 141 mmol/L (ref 134–144)
Total Bilirubin: 0.5 mg/dL (ref 0.0–1.2)
Total Protein: 6.3 g/dL (ref 6.0–8.5)

## 2013-11-29 LAB — NMR, LIPOPROFILE
Cholesterol: 270 mg/dL — ABNORMAL HIGH (ref <200)
HDL Cholesterol by NMR: 74 mg/dL (ref >=40)
HDL Particle Number: 42.3 umol/L (ref >=30.5)
LDL Particle Number: 1784 nmol/L — ABNORMAL HIGH (ref <1000)
LDL Size: 21.3 nm (ref >20.5)
LDLC SERPL CALC-MCNC: 157 mg/dL — ABNORMAL HIGH (ref <100)
LP-IR Score: <25 (ref <=45)
Small LDL Particle Number: 677 nmol/L — ABNORMAL HIGH (ref <=527)
Triglycerides by NMR: 197 mg/dL — ABNORMAL HIGH (ref <150)

## 2013-11-29 LAB — HEPATIC FUNCTION PANEL: Bilirubin, Direct: 0.12 mg/dL (ref 0.00–0.40)

## 2013-12-05 ENCOUNTER — Ambulatory Visit (HOSPITAL_COMMUNITY): Payer: Medicare Other

## 2013-12-26 DIAGNOSIS — Z1231 Encounter for screening mammogram for malignant neoplasm of breast: Secondary | ICD-10-CM | POA: Diagnosis not present

## 2014-01-02 ENCOUNTER — Ambulatory Visit (HOSPITAL_COMMUNITY)
Admission: RE | Admit: 2014-01-02 | Discharge: 2014-01-02 | Disposition: A | Discharge status: Home / Self Care | Payer: Medicare Other | Source: Ambulatory Visit | Attending: Family Medicine | Admitting: Family Medicine

## 2014-01-02 DIAGNOSIS — Z136 Encounter for screening for cardiovascular disorders: Secondary | ICD-10-CM

## 2014-01-02 DIAGNOSIS — Z0389 Encounter for observation for other suspected diseases and conditions ruled out: Secondary | ICD-10-CM | POA: Diagnosis not present

## 2014-01-02 DIAGNOSIS — I714 Abdominal aortic aneurysm, without rupture, unspecified: Secondary | ICD-10-CM | POA: Diagnosis present

## 2014-02-12 ENCOUNTER — Telehealth: Payer: Self-pay | Admitting: Family Medicine

## 2014-02-13 NOTE — Telephone Encounter (Signed)
Notified that ultrasound was normal

## 2014-02-19 ENCOUNTER — Encounter: Payer: Self-pay | Admitting: Family

## 2014-02-19 ENCOUNTER — Ambulatory Visit (INDEPENDENT_AMBULATORY_CARE_PROVIDER_SITE_OTHER): Payer: Medicare Other | Admitting: Family

## 2014-02-19 VITALS — BP 109/60 | HR 79 | Temp 99.1°F | Ht 63.0 in | Wt 125.2 lb

## 2014-02-19 DIAGNOSIS — E559 Vitamin D deficiency, unspecified: Secondary | ICD-10-CM | POA: Diagnosis not present

## 2014-02-19 DIAGNOSIS — Z0000 Encounter for general adult medical examination without abnormal findings: Secondary | ICD-10-CM

## 2014-02-19 DIAGNOSIS — M810 Age-related osteoporosis without current pathological fracture: Secondary | ICD-10-CM

## 2014-02-19 DIAGNOSIS — Z124 Encounter for screening for malignant neoplasm of cervix: Secondary | ICD-10-CM | POA: Diagnosis not present

## 2014-02-19 DIAGNOSIS — E538 Deficiency of other specified B group vitamins: Secondary | ICD-10-CM

## 2014-02-19 DIAGNOSIS — Z01419 Encounter for gynecological examination (general) (routine) without abnormal findings: Secondary | ICD-10-CM

## 2014-02-19 DIAGNOSIS — D367 Benign neoplasm of other specified sites: Secondary | ICD-10-CM

## 2014-02-19 NOTE — Progress Notes (Signed)
   Subjective:    Patient ID: Christine Sandoval, female    DOB: 1941-05-27, 73 y.o.   MRN: [id number]  Gynecologic Exam Pertinent negatives include no headaches.   Pt presents to office for annual physical with pap. Pt states she has been told she had hyperlipidemia, but does not take any medications. Pt states she took medications, but had reactions with muscle weakness and restless leg syndrome. Pt does not want to be on medication at this time.   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 Pt see's a cardiologists annually.    Review of Systems  Constitutional: Negative.   HENT: Negative.   Eyes: Negative.   Respiratory: Negative.  Negative for shortness of breath.   Cardiovascular: Negative.  Negative for palpitations.  Gastrointestinal: Negative.   Endocrine: Negative.   Genitourinary: Negative.   Musculoskeletal: Negative.   Neurological: Negative.  Negative for headaches.  Hematological: Negative.   Psychiatric/Behavioral: Negative.   All other systems reviewed and are negative.      Objective:   Physical Exam  Vitals reviewed. Constitutional: She is oriented to person, place, and time. She appears well-developed and well-nourished. No distress.  HENT:  Head: Normocephalic and atraumatic.  Right Ear: External ear normal.  Mouth/Throat: Oropharynx is clear and moist.  Eyes: Pupils are equal, round, and reactive to light.  Neck: Normal range of motion. Neck supple. No thyromegaly present.  Cardiovascular: Normal rate, regular rhythm, normal heart sounds and intact distal pulses.   No murmur heard. Pulmonary/Chest: Effort normal and breath sounds normal. No respiratory distress. She has no wheezes. Right breast exhibits no inverted nipple, no mass, no nipple discharge, no skin change and no tenderness. Left breast exhibits no inverted nipple, no mass, no nipple discharge, no skin change and no tenderness. Breasts are symmetrical.  Abdominal: Soft. Bowel sounds are normal. She  exhibits no distension. There is no tenderness.  Genitourinary:  Bimanual exam- no adnexal masses or tenderness, ovaries nonpalpable   Cervix parous and pink- No discharge   Musculoskeletal: Normal range of motion. She exhibits no edema and no tenderness.  Neurological: She is alert and oriented to person, place, and time. She has normal reflexes. No cranial nerve deficit.  Skin: Skin is warm and dry.  Psychiatric: She has a normal mood and affect. Her behavior is normal. Judgment and thought content normal.    BP 109/60  Pulse 79  Temp(Src) 99.1 F (37.3 C) (Oral)  Ht $R'5\' 3"'SP$  (1.6 m)  Wt 125 lb 3.2 oz (56.79 kg)  BMI 22.18 kg/m2       Assessment & Plan:  1. Unspecified vitamin D deficiency - Vit D  25 hydroxy (rtn osteoporosis monitoring)  2. Osteoporosis, unspecified - DG Bone Density; Future  3. Vitamin B 12 deficiency - Vitamin B12  4. Encounter for routine gynecological examination - Pap IG (Image Guided)  5. Annual physical exam - CMP14+EGFR  6. Dermoid cyst of leg, left - Ambulatory referral to Dermatology   Continue all meds Labs pending Health Maintenance reviewed Diet and exercise encouraged RTO 1 year  Evelina Dun, FNP

## 2014-02-19 NOTE — Patient Instructions (Signed)

## 2014-02-20 DIAGNOSIS — D219 Benign neoplasm of connective and other soft tissue, unspecified: Secondary | ICD-10-CM | POA: Diagnosis not present

## 2014-02-20 LAB — CMP14+EGFR
ALT: 19 IU/L (ref 0–32)
AST: 27 IU/L (ref 0–40)
Albumin/Globulin Ratio: 2.3 (ref 1.1–2.5)
Albumin: 4.4 g/dL (ref 3.5–4.8)
Alkaline Phosphatase: 70 IU/L (ref 39–117)
BUN/Creatinine Ratio: 19 (ref 11–26)
BUN: 17 mg/dL (ref 8–27)
CALCIUM: 9.9 mg/dL (ref 8.7–10.3)
CHLORIDE: 101 mmol/L (ref 97–108)
CO2: 22 mmol/L (ref 18–29)
Creatinine, Ser: 0.91 mg/dL (ref 0.57–1.00)
GFR calc Af Amer: 74 mL/min/{1.73_m2} (ref >59)
GFR calc non Af Amer: 64 mL/min/{1.73_m2} (ref >59)
GLUCOSE: 80 mg/dL (ref 65–99)
Globulin, Total: 1.9 g/dL (ref 1.5–4.5)
POTASSIUM: 4.9 mmol/L (ref 3.5–5.2)
Sodium: 140 mmol/L (ref 134–144)
TOTAL PROTEIN: 6.3 g/dL (ref 6.0–8.5)
Total Bilirubin: <0.2 mg/dL (ref 0.0–1.2)

## 2014-02-20 LAB — VITAMIN D 25 HYDROXY (VIT D DEFICIENCY, FRACTURES): VIT D 25 HYDROXY: 52.7 ng/mL (ref 30.0–100.0)

## 2014-02-20 LAB — VITAMIN B12: Vitamin B-12: 1614 pg/mL — ABNORMAL HIGH (ref 211–946)

## 2014-02-21 LAB — PAP IG (IMAGE GUIDED): PAP Smear Comment: 0

## 2014-03-05 ENCOUNTER — Telehealth: Payer: Self-pay | Admitting: Cardiovascular Disease

## 2014-03-06 NOTE — Telephone Encounter (Signed)
Closed encounter °

## 2014-03-15 DIAGNOSIS — D485 Neoplasm of uncertain behavior of skin: Secondary | ICD-10-CM | POA: Diagnosis not present

## 2014-03-15 DIAGNOSIS — D2370 Other benign neoplasm of skin of unspecified lower limb, including hip: Secondary | ICD-10-CM | POA: Diagnosis not present

## 2014-03-27 DIAGNOSIS — Z85828 Personal history of other malignant neoplasm of skin: Secondary | ICD-10-CM | POA: Diagnosis not present

## 2014-03-27 DIAGNOSIS — L570 Actinic keratosis: Secondary | ICD-10-CM | POA: Diagnosis not present

## 2014-03-27 DIAGNOSIS — D485 Neoplasm of uncertain behavior of skin: Secondary | ICD-10-CM | POA: Diagnosis not present

## 2014-03-27 DIAGNOSIS — D235 Other benign neoplasm of skin of trunk: Secondary | ICD-10-CM | POA: Diagnosis not present

## 2014-03-28 ENCOUNTER — Ambulatory Visit (INDEPENDENT_AMBULATORY_CARE_PROVIDER_SITE_OTHER): Payer: Medicare Other | Admitting: Pharmacist

## 2014-03-28 ENCOUNTER — Encounter: Payer: Self-pay | Admitting: Pharmacist

## 2014-03-28 ENCOUNTER — Ambulatory Visit (INDEPENDENT_AMBULATORY_CARE_PROVIDER_SITE_OTHER): Payer: Medicare Other

## 2014-03-28 VITALS — Ht 63.0 in | Wt 125.0 lb

## 2014-03-28 DIAGNOSIS — M810 Age-related osteoporosis without current pathological fracture: Secondary | ICD-10-CM

## 2014-03-28 NOTE — Patient Instructions (Signed)
Fall Prevention and Home Safety Falls cause injuries and can affect all age groups. It is possible to use preventive measures to significantly decrease the likelihood of falls. There are many simple measures which can make your home safer and prevent falls. OUTDOORS  Repair cracks and edges of walkways and driveways.  Remove high doorway thresholds.  Trim shrubbery on the main path into your home.  Have good outside lighting.  Clear walkways of tools, rocks, debris, and clutter.  Check that handrails are not broken and are securely fastened. Both sides of steps should have handrails.  Have leaves, snow, and ice cleared regularly.  Use sand or salt on walkways during winter months.  In the garage, clean up grease or oil spills. BATHROOM  Install night lights.  Install grab bars by the toilet and in the tub and shower.  Use non-skid mats or decals in the tub or shower.  Place a plastic non-slip stool in the shower to sit on, if needed.  Keep floors dry and clean up all water on the floor immediately.  Remove soap buildup in the tub or shower on a regular basis.  Secure bath mats with non-slip, double-sided rug tape.  Remove throw rugs and tripping hazards from the floors. BEDROOMS  Install night lights.  Make sure a bedside light is easy to reach.  Do not use oversized bedding.  Keep a telephone by your bedside.  Have a firm chair with side arms to use for getting dressed.  Remove throw rugs and tripping hazards from the floor. KITCHEN  Keep handles on pots and pans turned toward the center of the stove. Use back burners when possible.  Clean up spills quickly and allow time for drying.  Avoid walking on wet floors.  Avoid hot utensils and knives.  Position shelves so they are not too high or low.  Place commonly used objects within easy reach.  If necessary, use a sturdy step stool with a grab bar when reaching.  Keep electrical cables out of the  way.  Do not use floor polish or wax that makes floors slippery. If you must use wax, use non-skid floor wax.  Remove throw rugs and tripping hazards from the floor. STAIRWAYS  Never leave objects on stairs.  Place handrails on both sides of stairways and use them. Fix any loose handrails. Make sure handrails on both sides of the stairways are as long as the stairs.  Check carpeting to make sure it is firmly attached along stairs. Make repairs to worn or loose carpet promptly.  Avoid placing throw rugs at the top or bottom of stairways, or properly secure the rug with carpet tape to prevent slippage. Get rid of throw rugs, if possible.  Have an electrician put in a light switch at the top and bottom of the stairs. OTHER FALL PREVENTION TIPS  Wear low-heel or rubber-soled shoes that are supportive and fit well. Wear closed toe shoes.  When using a stepladder, make sure it is fully opened and both spreaders are firmly locked. Do not climb a closed stepladder.  Add color or contrast paint or tape to grab bars and handrails in your home. Place contrasting color strips on first and last steps.  Learn and use mobility aids as needed. Install an electrical emergency response system.  Turn on lights to avoid dark areas. Replace light bulbs that burn out immediately. Get light switches that glow.  Arrange furniture to create clear pathways. Keep furniture in the same place.    Firmly attach carpet with non-skid or double-sided tape.  Eliminate uneven floor surfaces.  Select a carpet pattern that does not visually hide the edge of steps.  Be aware of all pets. OTHER HOME SAFETY TIPS  Set the water temperature for 120 F (48.8 C).  Keep emergency numbers on or near the telephone.  Keep smoke detectors on every level of the home and near sleeping areas. Document Released: 07/10/2002 Document Revised: 01/19/2012 Document Reviewed: 10/09/2011 [employer] Patient Information 2015  [employer]. This information is not intended to replace advice given to you by your health care provider. Make sure you discuss any questions you have with your health care provider.                Exercise for Strong Bones  Exercise is important to build and maintain strong bones / bone density.  There are 2 types of exercises that are important to building and maintaining strong bones:  Weight- bearing and muscle-stregthening.  Weight-bearing Exercises  These exercises include activities that make you move against gravity while staying upright. Weight-bearing exercises can be high-impact or low-impact.  High-impact weight-bearing exercises help build bones and keep them strong. If you have broken a bone due to osteoporosis or are at risk of breaking a bone, you may need to avoid high-impact exercises. If you're not sure, you should check with your healthcare provider.  Examples of high-impact weight-bearing exercises are: Dancing  Doing high-impact aerobics  Hiking  Jogging/running  Jumping Rope  Stair climbing  Tennis  Low-impact weight-bearing exercises can also help keep bones strong and are a safe alternative if you cannot do high-impact exercises.   Examples of low-impact weight-bearing exercises are: Using elliptical training machines  Doing low-impact aerobics  Using stair-step machines  Fast walking on a treadmill or outside   Muscle-Strengthening Exercises These exercises include activities where you move your body, a weight or some other resistance against gravity. They are also known as resistance exercises and include: Lifting weights  Using elastic exercise bands  Using weight machines  Lifting your own body weight  Functional movements, such as standing and rising up on your toes  Yoga and Pilates can also improve strength, balance and flexibility. However, certain positions may not be safe for people with osteoporosis or those at increased risk of broken  bones. For example, exercises that have you bend forward may increase the chance of breaking a bone in the spine.   Non-Impact Exercises There are other types of exercises that can help prevent falls.  Non-impact exercises can help you to improve balance, posture and how well you move in everyday activities. Some of these exercises include: Balance exercises that strengthen your legs and test your balance, such as Tai Chi, can decrease your risk of falls.  Posture exercises that improve your posture and reduce rounded or "sloping" shoulders can help you decrease the chance of breaking a bone, especially in the spine.  Functional exercises that improve how well you move can help you with everyday activities and decrease your chance of falling and breaking a bone. For example, if you have trouble getting up from a chair or climbing stairs, you should do these activities as exercises.   **A physical therapist can teach you balance, posture and functional exercises. He/she can also help you learn which exercises are safe and appropriate for you.  [facility] has a physical therapy office in [place] in front of our office and referrals can be made for assessments   and treatment as needed and strength and balance training.  If you would like to have an assessment with [facility] and our physical therapy team please let a nurse or provider know.    

## 2014-03-28 NOTE — Progress Notes (Signed)
Patient ID: Christine Sandoval, female   DOB: 07/30/1941, 73 y.o.   MRN: [id number] [facility] Current Height: Height: 5\' 3"  (160 cm)      Max Lifetime Height:  5' 3.5" Current Weight: Weight: 125 lb (56.7 kg)       Ethnicity:Caucasian    HPI: Patient here today to recheck DEXA and discuss osteoporosis.  She has refused Relast and Prolia when last checked in 2012.  She tried Actonel and Fosamax in past but stopped due to GI side effects  Back Pain?  No       Kyphosis?  Yes Prior fracture?  Yes - toe                                                             PMH: Age at menopause:  73yo Hysterectomy?  No Oophorectomy?  No HRT? No Steroid Use?  No Thyroid med?  No History of cancer?  Yes - skin cancer History of digestive disorders (ie Crohn's)?  No Current or previous eating disorders?  No Last Vitamin D Result:  52.7 (02/19/2014) Last GFR Result:  64 (02/19/2014)   FH/SH: Family history of osteoporosis?  Yes - mother Parent with history of hip fracture?  No Family history of breast cancer?  No Exercise?  Yes - walking and stretching daily Smoking?  No Alcohol?  No    Calcium Assessment Calcium Intake  # of servings/day  Calcium mg  Milk (8 oz) - almond 1  x  450 = 450mg   Yogurt (4 oz) 0 x  200 = 0  Cheese (1 oz) 0 x  200 = 0  Other Calcium sources   250mg   Ca supplement 600mg  qd and MVI = 1000mg    Estimated calcium intake per day 1700mg     DEXA Results Date of Test T-Score for AP Spine L1-L4 T-Score for Total Left Hip T-Score for Total Right Hip  03/28/2014 -3.4 -2.7 -2.9  08/13/2010 -3.2 -2.5 -2.6  03/28/2008 -2.8 -2.3 -2.7        Assessment: Osteoporosis with decreased BMD over last 3 years - with history of refusing pharmacotherapy for osteoporosis  Recommendations: 1.  Discussed high fracture risk and treatment options.  Gave information about Forteo and Prolia.  Will follow up in 1-2 weeks to see if patient has questions and to decide on therapy. 2.   continue calcium 1200mg  daily through supplementation or diet.  3.  continue weight bearing exercise - 30 minutes at least 4 days per week.   4.  Counseled and educated about fall risk and prevention.  Recheck DEXA:  1 year - 2 years  Time spent counseling patient:  30 minutes  Cherre Robins, PharmD, CPP

## 2014-04-12 ENCOUNTER — Encounter: Payer: Self-pay | Admitting: Cardiovascular Disease

## 2014-04-12 ENCOUNTER — Ambulatory Visit (INDEPENDENT_AMBULATORY_CARE_PROVIDER_SITE_OTHER): Payer: Medicare Other | Admitting: Cardiovascular Disease

## 2014-04-12 VITALS — BP 136/90 | HR 64 | Resp 20 | Ht 63.0 in | Wt 126.7 lb

## 2014-04-12 DIAGNOSIS — I2510 Atherosclerotic heart disease of native coronary artery without angina pectoris: Secondary | ICD-10-CM | POA: Diagnosis not present

## 2014-04-12 DIAGNOSIS — E785 Hyperlipidemia, unspecified: Secondary | ICD-10-CM

## 2014-04-12 DIAGNOSIS — I201 Angina pectoris with documented spasm: Secondary | ICD-10-CM

## 2014-04-12 DIAGNOSIS — Z79899 Other long term (current) drug therapy: Secondary | ICD-10-CM

## 2014-04-12 DIAGNOSIS — E782 Mixed hyperlipidemia: Secondary | ICD-10-CM

## 2014-04-12 NOTE — Patient Instructions (Signed)
Your physician recommends that you return for lab work in: [place] lab work at [facility].  Dr. Sallyanne Kuster recommends that you schedule a follow-up appointment in: One year.

## 2014-04-13 ENCOUNTER — Encounter: Payer: Self-pay | Admitting: Cardiovascular Disease

## 2014-04-13 NOTE — Progress Notes (Signed)
Patient ID: Christine Sandoval, female   DOB: [birth year], 73 y.o.   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 The first episode occurred in the 1980s. The second episode occurred in 2010. Angiography after the episode in 2010 did not show any evidence of coronary stenoses The suspicion is that this is related to coronary vasospasm, although she quit smoking in 1995. She has really implemented a number of very healthful lifestyle changes and is lean and very active.  She is asymptomatic. She has not required sublingual nitroglycerin. She is no longer taking pravastatin. She is taking red yeast rice. It's not clear to me why she switched. She has noticed some swelling in her ankles which resolves after overnight recumbent position. When she was taking pravastatin her LDL was less than 100. The most recent lipid profile, performed without statins shows an LDL of 121 but also an excellent HDL of 78.    Allergies  Allergen Reactions  . Codeine   . Hydrochlorothiazide     Current Outpatient Prescriptions  Medication Sig Dispense Refill  . Arginine 500 MG CAPS Take 1 capsule by mouth daily. L-Arginine      . Ascorbic Acid (VITAMIN C) 1000 MG tablet Take 2 tablet a day      . aspirin 81 MG tablet Take 162 mg by mouth daily.       . B Complex Vitamins (B-COMPLEX/B-12 PO) Take 1 capsule by mouth daily.        Marland Kitchen BIOTIN PO Take 1 tablet by mouth daily.      . Calcium Citrate-Vitamin D (CALCIUM CITRATE + D PO) Take 500 mg by mouth.      . Cholecalciferol (VITAMIN D) 1000 UNITS capsule Take 2,000 Units by mouth daily.       . Coenzyme Q10 (CO Q 10) 100 MG CAPS Take 1 capsule by mouth daily.      . fish oil-omega-3 fatty acids 1000 MG capsule Take 1 g by mouth  daily.        . folic acid (FOLVITE) 347 MCG tablet Take 400 mcg by mouth daily.      Nyoka Cowden Tea, Camillia sinensis, ([employer] SUPER GREEN TEA EXTRACT) 250 MG CAPS Take 1 capsule by mouth 3 (three) times daily. Takes 350 egcg bid      . L-Lysine 500 MG CAPS Take 1 capsule by mouth daily.      . Magnesium 400 MG TABS Take 1 tablet by mouth at bedtime.      . Multiple Vitamin (MULTIVITAMIN) capsule Take 1 capsule by mouth daily.        . nitroGLYCERIN (NITROSTAT) 0.4 MG SL tablet Place 1 tablet (0.4 mg total) under the tongue every 5 (five) minutes as needed for chest pain.  25 tablet  3  . Red Yeast Rice Extract (RED YEAST RICE PO) Take 1 capsule by mouth 3 (three) times daily.       Current Facility-Administered Medications  Medication Dose Route Frequency Provider Last Rate Last Dose  . 0.9 %  sodium chloride infusion  500 mL Intravenous Continuous Irene Shipper, MD        Past Medical History  Diagnosis Date  . CAD (coronary artery disease)   . OAB (overactive bladder)   . Hyperlipidemia   .  Mitral valve prolapse     mild  . COPD (chronic obstructive pulmonary disease)   . Osteoporosis   . STEMI (ST elevation myocardial infarction) 2011    x 2 from coronary vasospasm  . History of coronary vasospasm   . Venous insufficiency     Past Surgical History  Procedure Laterality Date  . Colonoscopy      history of polyps  . Melanoma excision    . Breast lumpectomy      benign  . Dilation and curettage of uterus       X 2  . Cataract extraction      bilateral eyes  . Cardiac catheterization  03/07/2009    Normal coronaries  . Ablation saphenous vein w/ rfa  07/05/2010    left  . US echocardiography  05/23/2008    MVP,mild mitral annular ca+,mild MR,AOV mildlly sclerotic  . Nm myocar perf wall motion  05/23/2008    No significant ischemia    Family History  Problem Relation Age of Onset  . Colon cancer Maternal Aunt   . Colon cancer Maternal Uncle   . Alcohol abuse Mother   .  Heart disease Sister 82    CHF  . Heart failure Father     History   Social History  . Marital Status: Married    Spouse Name: N/A    Number of Children: N/A  . Years of Education: N/A   Occupational History  . Not on file.   Social History Main Topics  . Smoking status: Former Smoker    Types: Cigarettes    Quit date: 08/03/1988  . Smokeless tobacco: Never Used  . Alcohol Use: No  . Drug Use: No  . Sexual Activity: Not on file   Other Topics Concern  . Not on file   Social History Narrative  . No narrative on file    Review of systems: The patient specifically denies any chest pain at rest or with exertion, dyspnea at rest or with exertion, orthopnea, paroxysmal nocturnal dyspnea, syncope, palpitations, focal neurological deficits, intermittent claudication, lower extremity edema, unexplained weight gain, cough, hemoptysis or wheezing.  The patient also denies abdominal pain, nausea, vomiting, dysphagia, diarrhea, constipation, polyuria, polydipsia, dysuria, hematuria, frequency, urgency, abnormal bleeding or bruising, fever, chills, unexpected weight changes, mood swings, change in skin or hair texture, change in voice quality, auditory or visual problems, allergic reactions or rashes, new musculoskeletal complaints other than usual "aches and pains".   PHYSICAL EXAM BP 136/90  Pulse 64  Resp 20  Ht 5\' 3"  (1.6 m)  Wt 126 lb 11.2 oz (57.471 kg)  BMI 22.45 kg/m2  General: Alert, oriented x3, no distress Head: no evidence of trauma, PERRL, EOMI, no exophtalmos or lid lag, no myxedema, no xanthelasma; normal ears, nose and oropharynx Neck: normal jugular venous pulsations and no hepatojugular reflux; brisk carotid pulses without delay and no carotid bruits Chest: clear to auscultation, no signs of consolidation by percussion or palpation, normal fremitus, symmetrical and full respiratory excursions Cardiovascular: normal position and quality of the apical impulse,  regular rhythm, normal first and second heart sounds, no murmurs, rubs or gallops Abdomen: no tenderness or distention, no masses by palpation, no abnormal pulsatility or arterial bruits, normal bowel sounds, no hepatosplenomegaly Extremities: no clubbing, cyanosis or edema; 2+ radial, ulnar and brachial pulses bilaterally; 2+ right femoral, posterior tibial and dorsalis pedis pulses; 2+ left femoral, posterior tibial and dorsalis pedis pulses; no subclavian or femoral bruits Neurological: grossly nonfocal  EKG: NSR, normal  Lipid Panel     Component Value Date/Time   CHOL 270* 11/28/2013 0803   TRIG 197* 11/28/2013 0803   TRIG 211* 08/24/2013 1121   TRIG 148 02/21/2013 1031   HDL 74 11/28/2013 0803   HDL 78 08/24/2013 1121   HDL 79 03/07/2009 1300   CHOLHDL 3.1 08/24/2013 1121   CHOLHDL 2.0 03/07/2009 1300   VLDL 19 03/07/2009 1300   LDLCALC 157* 11/28/2013 0803   LDLCALC 121* 08/24/2013 1121   LDLCALC 85 02/21/2013 1031   LDLCALC  Value: 63        Total Cholesterol/HDL:CHD Risk Coronary Heart Disease Risk Table                     Men   Women  1/2 Average Risk   3.4   3.3  Average Risk       5.0   4.4  2 X Average Risk   9.6   7.1  3 X Average Risk  23.4   11.0        Use the calculated Patient Ratio above and the CHD Risk Table to determine the patient's CHD Risk.        ATP III CLASSIFICATION (LDL):  <100     mg/dL   Optimal  100-129  mg/dL   Near or Above                    Optimal  130-159  mg/dL   Borderline  160-189  mg/dL   High  >190     mg/dL   Very High 03/07/2009 1300    BMET    Component Value Date/Time   NA 140 02/19/2014 1549   NA 141 02/21/2013 1031   K 4.9 02/19/2014 1549   CL 101 02/19/2014 1549   CO2 22 02/19/2014 1549   GLUCOSE 80 02/19/2014 1549   GLUCOSE 97 02/21/2013 1031   BUN 17 02/19/2014 1549   BUN 11 02/21/2013 1031   CREATININE 0.91 02/19/2014 1549   CREATININE 0.97 02/21/2013 1031   CALCIUM 9.9 02/19/2014 1549   GFRNONAA 64 02/19/2014 1549   GFRNONAA 60 02/21/2013 1031    GFRAA 74 02/19/2014 1549   GFRAA 69 02/21/2013 1031     ASSESSMENT AND PLAN Coronary vasospasm  Presumed cause of 2 previous episodes of non-ST segment elevation myocardial infarction. No evidence of luminal coronary obstruction by coronary angiography in 2010. She has stopped smoking. She does not have migraine headaches or Raynaud's syndrome.  HLD (hyperlipidemia)  She has really made a lot of very positive changes in her lifestyle. She quit smoking and is very lean and eats a very healthy diet. She is no longer taking any statins and her LDL was high earlier this year. I would recommend a target LDL of less than 100, even taking into consideration her excellent HDL of 70. Recheck labs. I did tell her that while she is taking red yeast rice she is taking a small dose of a naturally occurring statin, and that she might simply require a more effective pharmacologic agent.   Patient Instructions  Your physician recommends that you return for lab work in: [place] lab work at [facility].  Dr. Sallyanne Kuster recommends that you schedule a follow-up appointment in: One year.       Orders Placed This Encounter  Procedures  . Comprehensive metabolic panel  . Lipid panel  . EKG 12-Lead   Meds ordered this encounter  Medications  . Magnesium 400 MG TABS    Sig: Take 1 tablet by mouth at bedtime.  . Red Yeast Rice Extract (RED YEAST RICE PO)    Sig: Take 1 capsule by mouth 3 (three) times daily.  Marland Kitchen BIOTIN PO    Sig: Take 1 tablet by mouth daily.    Holli Humbles, MD, [place] [phone] office [phone] pager

## 2014-05-11 ENCOUNTER — Telehealth: Payer: Self-pay | Admitting: Pharmacist

## 2014-05-11 NOTE — Telephone Encounter (Signed)
Message copied by Cherre Robins on Fri May 11, 2014  1:24 PM ------      Message from: Cherre Robins      Created: Wed Mar 28, 2014  4:57 PM      Regarding: call       Call patient to follow up on treatment for osteoporosis ------

## 2014-05-11 NOTE — Telephone Encounter (Signed)
Checked into cost of Prolia - patient would pay $0.  Tried to call to see if she wanted to start.  No answer - LMonVM

## 2014-05-16 NOTE — Telephone Encounter (Signed)
Spoke with patient about Prolia cost.  She is interested but she wants to do her own research about side effects and benefits.  She will call back with decision to get prolia or not.

## 2014-05-23 ENCOUNTER — Telehealth: Payer: Self-pay | Admitting: Family Medicine

## 2014-05-23 NOTE — Progress Notes (Signed)
Patient ID: Christine Sandoval, female   DOB: 12/14/1940, 73 y.o.   MRN: [id number] Over the last 4-6 weeks I have gotten insurance coverage information regarding Forteo and Prolia for Mrs. Christine Sandoval.  I also provided information about both medications.  She has reviewed information and has decided against pharmacotherapy for osteoporosis.   I have explained that she is at a high risk of fracturing a bone and she understands.    Cherre Robins, PharmD, CPP

## 2014-05-23 NOTE — Telephone Encounter (Signed)
Noted that patient has declined treatment for osteoporosis.  She is aware that she is at high risk of fracture due to low BMD / osteoporosis

## 2014-06-18 ENCOUNTER — Ambulatory Visit: Payer: Medicare Other | Admitting: *Deleted

## 2014-06-18 ENCOUNTER — Ambulatory Visit (INDEPENDENT_AMBULATORY_CARE_PROVIDER_SITE_OTHER): Payer: Medicare Other | Admitting: *Deleted

## 2014-06-18 DIAGNOSIS — Z23 Encounter for immunization: Secondary | ICD-10-CM | POA: Diagnosis not present

## 2015-01-28 ENCOUNTER — Ambulatory Visit (INDEPENDENT_AMBULATORY_CARE_PROVIDER_SITE_OTHER): Payer: Medicare Other | Admitting: Family

## 2015-01-28 ENCOUNTER — Encounter (INDEPENDENT_AMBULATORY_CARE_PROVIDER_SITE_OTHER): Payer: Self-pay

## 2015-01-28 ENCOUNTER — Encounter: Payer: Self-pay | Admitting: Family

## 2015-01-28 VITALS — BP 133/79 | HR 70 | Temp 96.7°F | Ht 63.0 in | Wt 124.6 lb

## 2015-01-28 DIAGNOSIS — H9201 Otalgia, right ear: Secondary | ICD-10-CM | POA: Diagnosis not present

## 2015-01-28 DIAGNOSIS — J019 Acute sinusitis, unspecified: Secondary | ICD-10-CM

## 2015-01-28 DIAGNOSIS — R51 Headache: Secondary | ICD-10-CM

## 2015-01-28 DIAGNOSIS — R0602 Shortness of breath: Secondary | ICD-10-CM | POA: Diagnosis not present

## 2015-01-28 DIAGNOSIS — E785 Hyperlipidemia, unspecified: Secondary | ICD-10-CM | POA: Diagnosis not present

## 2015-01-28 DIAGNOSIS — R519 Headache, unspecified: Secondary | ICD-10-CM

## 2015-01-28 MED ORDER — AMOXICILLIN-POT CLAVULANATE 875-125 MG PO TABS: 1.0000 | ORAL_TABLET | Freq: Two times a day (BID) | ORAL | Status: DC

## 2015-01-28 NOTE — Progress Notes (Signed)
Subjective:    Patient ID: Christine Sandoval, female    DOB: [birth year], 74 y.o.   MRN: [id number]  Otalgia  There is pain in the right ear. This is a new problem. The current episode started in the past 7 days (Saturday). The problem occurs constantly. The problem has been gradually worsening. There has been no fever. The pain is at a severity of 6/10. The pain is moderate. Associated symptoms include a sore throat. Pertinent negatives include no coughing, diarrhea, ear discharge, headaches, rhinorrhea or vomiting. She has tried acetaminophen and NSAIDs for the symptoms. The treatment provided mild relief.      Review of Systems  Constitutional: Negative.   HENT: Positive for ear pain and sore throat. Negative for ear discharge and rhinorrhea.   Eyes: Negative.   Respiratory: Negative.  Negative for cough and shortness of breath.   Cardiovascular: Negative.  Negative for palpitations.  Gastrointestinal: Negative.  Negative for vomiting and diarrhea.  Endocrine: Negative.   Genitourinary: Negative.   Musculoskeletal: Negative.   Neurological: Negative.  Negative for headaches.  Hematological: Negative.   Psychiatric/Behavioral: Negative.   All other systems reviewed and are negative.      Objective:   Physical Exam  Constitutional: She is oriented to person, place, and time. She appears well-developed and well-nourished. No distress.  HENT:  Head: Normocephalic and atraumatic.  Right Ear: External ear normal.  Left Ear: External ear normal.  Nose: Nose normal.  Mouth/Throat: Oropharynx is clear and moist.  Eyes: Pupils are equal, round, and reactive to light.  Neck: Normal range of motion. Neck supple. No thyromegaly present.  Cardiovascular: Normal rate, regular rhythm, normal heart sounds and intact distal pulses.   No murmur heard. Pulmonary/Chest: Effort normal and breath sounds normal. No respiratory distress. She has no wheezes.  Abdominal: Soft. Bowel sounds are normal.  She exhibits no distension. There is no tenderness.  Musculoskeletal: Normal range of motion. She exhibits no edema or tenderness.  Neurological: She is alert and oriented to person, place, and time. She has normal reflexes. No cranial nerve deficit.  Skin: Skin is warm and dry.  Psychiatric: She has a normal mood and affect. Her behavior is normal. Judgment and thought content normal.  Vitals reviewed.     BP 133/79 mmHg  Pulse 70  Temp(Src) 96.7 F (35.9 C) (Oral)  Ht 5\' 3"  (1.6 m)  Wt 124 lb 9.6 oz (56.518 kg)  BMI 22.08 kg/m2     Assessment & Plan:  1. Acute sinusitis, recurrence not specified, unspecified location -- Take meds as prescribed - Use a cool mist humidifier  -Use saline nose sprays frequently -Saline irrigations of the nose can be very helpful if done frequently.  * 4X daily for 1 week*  * Use of a nettie pot can be helpful with this. Follow directions with this* -Force fluids -For any cough or congestion  Use plain Mucinex- regular strength or max strength is fine   * Children- consult with Pharmacist for dosing -For fever or aces or pains- take tylenol or ibuprofen appropriate for age and weight.  * for fevers greater than 101 orally you may alternate ibuprofen and tylenol every  3 hours. -Throat lozenges if help - amoxicillin-clavulanate (AUGMENTIN) 875-125 MG per tablet; Take 1 tablet by mouth 2 (two) times daily.  Dispense: 14 tablet; Refill: 0  2. Scalp tenderness - High sensitivity CRP - Sedimentation rate  3. Otalgia of right ear - High sensitivity CRP - Sedimentation rate -  amoxicillin-clavulanate (AUGMENTIN) 875-125 MG per tablet; Take 1 tablet by mouth 2 (two) times daily.  Dispense: 14 tablet; Refill: 0  Labs pending to rule out Temporal Arthritis- If positive  will initiate high dose steroids  Evelina Dun, FNP

## 2015-01-28 NOTE — Patient Instructions (Signed)
Sinusitis Sinusitis is redness, soreness, and inflammation of the paranasal sinuses. Paranasal sinuses are air pockets within the bones of your face (beneath the eyes, the middle of the forehead, or above the eyes). In healthy paranasal sinuses, mucus is able to drain out, and air is able to circulate through them by way of your nose. However, when your paranasal sinuses are inflamed, mucus and air can become trapped. This can allow bacteria and other germs to grow and cause infection. Sinusitis can develop quickly and last only a short time (acute) or continue over a long period (chronic). Sinusitis that lasts for more than 12 weeks is considered chronic.  CAUSES  Causes of sinusitis include:  Allergies.  Structural abnormalities, such as displacement of the cartilage that separates your nostrils (deviated septum), which can decrease the air flow through your nose and sinuses and affect sinus drainage.  Functional abnormalities, such as when the small hairs (cilia) that line your sinuses and help remove mucus do not work properly or are not present. SIGNS AND SYMPTOMS  Symptoms of acute and chronic sinusitis are the same. The primary symptoms are pain and pressure around the affected sinuses. Other symptoms include:  Upper toothache.  Earache.  Headache.  Bad breath.  Decreased sense of smell and taste.  A cough, which worsens when you are lying flat.  Fatigue.  Fever.  Thick drainage from your nose, which often is green and may contain pus (purulent).  Swelling and warmth over the affected sinuses. DIAGNOSIS  Your health care provider will perform a physical exam. During the exam, your health care provider may:  Look in your nose for signs of abnormal growths in your nostrils (nasal polyps).  Tap over the affected sinus to check for signs of infection.  View the inside of your sinuses (endoscopy) using an imaging device that has a light attached (endoscope). If your health  care provider suspects that you have chronic sinusitis, one or more of the following tests may be recommended:  Allergy tests.  Nasal culture. A sample of mucus is taken from your nose, sent to a lab, and screened for bacteria.  Nasal cytology. A sample of mucus is taken from your nose and examined by your health care provider to determine if your sinusitis is related to an allergy. TREATMENT  Most cases of acute sinusitis are related to a viral infection and will resolve on their own within 10 days. Sometimes medicines are prescribed to help relieve symptoms (pain medicine, decongestants, nasal steroid sprays, or saline sprays).  However, for sinusitis related to a bacterial infection, your health care provider will prescribe antibiotic medicines. These are medicines that will help kill the bacteria causing the infection.  Rarely, sinusitis is caused by a fungal infection. In theses cases, your health care provider will prescribe antifungal medicine. For some cases of chronic sinusitis, surgery is needed. Generally, these are cases in which sinusitis recurs more than 3 times per year, despite other treatments. HOME CARE INSTRUCTIONS   Drink plenty of water. Water helps thin the mucus so your sinuses can drain more easily.  Use a humidifier.  Inhale steam 3 to 4 times a day (for example, sit in the bathroom with the shower running).  Apply a warm, moist washcloth to your face 3 to 4 times a day, or as directed by your health care provider.  Use saline nasal sprays to help moisten and clean your sinuses.  Take medicines only as directed by your health care provider.    If you were prescribed either an antibiotic or antifungal medicine, finish it all even if you start to feel better. SEEK IMMEDIATE MEDICAL CARE IF:  You have increasing pain or severe headaches.  You have nausea, vomiting, or drowsiness.  You have swelling around your face.  You have vision problems.  You have a stiff  neck.  You have difficulty breathing. MAKE SURE YOU:   Understand these instructions.  Will watch your condition.  Will get help right away if you are not doing well or get worse. Document Released: 07/20/2005 Document Revised: 12/04/2013 Document Reviewed: 08/04/2011 [facility] Patient Information 2015 [place], [place]. This information is not intended to replace advice given to you by your health care provider. Make sure you discuss any questions you have with your health care provider. Temporal Arteritis Arteries are blood vessels that carry blood from the heart to all parts of the body. Temporal arteritis is a swelling (inflammation) of certain large arteries. This usually affects arteries in the head and neck area, including arteries in the area on the side of the head, between the ears and eyes (temples). The condition can be very painful. It also can cause serious problems, even blindness. Early diagnosis and treatment is very important. CAUSES  Temporal arteritis results from the body reacting to injury or infection (inflammation). This may occur when the body's immune system (which fights germs and disease) makes a mistake. It attacks its own arteries. No one knows why this happens. However, certain things (risk factors) make it more likely that a person will develop temporal arteritis. They include:  Age. Most people with temporal arteritis are older than 50. The average age is 50.  Sex. Three times more women than men develop the condition.  Race and ethnic background. Caucasians are more likely to have temporal arteritis than other races. So are people whose families came from [place], [place] or [place]  Having polymyalgia rheumatica (PMR). This condition causes stiffness and pain in the joints of the neck, shoulders and hips. About 15% of people with PMR also have temporal arteritis. SYMPTOMS  Not everyone with temporal arteritis has the same  symptoms. Some people have just one symptom. Others may have several. The most common symptom is a new headache, often in the temple region. Symptoms may show up in other parts of the body too.   Symptoms affecting the head may include:  Temporal arteries that feel hard or swollen. It may hurt when the temples are touched.  Pain when combing your hair, or when laying your head on a pillow.  Pain in the jaw when chewing.  Pain in the throat or tongue.  Visual problems, including sudden loss of vision in one eye, or seeing double.  Symptoms in other parts of the body may include:  Fever.  Fatigue.  A dry cough.  Pain in the hips and shoulders.  Pain in the arms during exercise.  Depression.  Weight loss. DIAGNOSIS  Symptoms of temporal arteritis are similar to symptoms for other conditions. This can make it hard to tell if you have the condition. To be sure, your caregiver will ask about your symptoms and do a physical exam. Certain tests may be necessary, such as:   An exam of your temples. Often, the temporal arteries will be swollen and hard. This can be felt.  A complete blood count. This test shows how many red blood cells are in your blood. Most people with temporal arteritis do not have enough  red blood cells (anemic).  Erythrocyte sedimentation (also called sed rate test). It measures inflammation in the body. Almost everyone with temporal arteritis has a high sed rate.  C reactive protein test. This also shows if there is inflammation.  Biopsy a temporal artery. This means the caregiver will take out a small piece of an artery. Then, it is checked under a microscope for inflammation. More than one biopsy may be needed. That is because inflammation can be in one part of an artery and not in others. Your caregiver may need to check more than one spot. TREATMENT  Starting treatment right away is very important. Often, you will need to see a specialist in immunologic  diseases (rheumatologist). Goals of treatment include protecting your eyesight. Once vision is gone, it might not come back. The normal treatment is medication. It usually works well and quickly. Most people start getting better in a few days. Medication options include:  Corticosteroids. These are powerful drugs that fight inflammation. These drugs are most often used to treat temporal arteritis.  Usually, a high dose is taken at first. After symptoms improve, a smaller dose is used. The goal is to take the smallest dose possible and still control your symptoms. That is because using corticosteroids for a long time can cause problems. They can make muscles and bones weak. They can cause blood pressure to go up, and cause diabetes. Also, people often gain weight when they take corticosteroids. Corticosteroids may need to be taken for one or two years.  Several newer drugs are being tested to treat temporal arteritis. Researchers are testing to see if new drugs will work as well as corticosteroids, but cause fewer problems than them. Testing of these drugs is not yet complete.  Some specialists recommend low dose aspirin to prevent blood clots. HOME CARE INSTRUCTIONS   Take any corticosteroids that your caregiver prescribes. Follow the directions carefully.  Take any vitamins or supplements that your caregiver suggests. This may include vitamin D and calcium. They help keep your bones from becoming weak.  Keep all appointments for checkups. Your caregiver will watch for any problems from the medication. Checkups may include:  Periodic blood tests.  Bone density testing. This checks how strong or weak your bones are.  Blood pressure checks. If your blood pressure rises, you may need to take a drug to control it while you are taking corticosteroids.  Blood sugar checks. This is to be sure you are not developing diabetes. If you have diabetes, corticosteroid medications may make it worse and  require increased treatment.  Exercise. First, talk with your caregiver about what would be OK for you to do. Aerobic exercise (which increases your heart rate) is usually suggested. It does not have to require a lot of energy. Walking is aerobic exercise. This type of exercise is good because it helps prevent bone loss. It also helps control your blood pressure.  Follow a healthy diet. The goal is to prevent bone damage and diabetes. Include good sources of protein in your diet. Also, include fruits, vegetables and whole grains. Your caregiver can refer you to an expert on healthy eating (dietitian) for more advice. SEEK MEDICAL CARE IF:   The symptoms that lead to your diagnosis return.  You develop worsening fever, fatigue, headache, weight loss, or pain in your jaw.  You develop signs of infection. Infections can be worse if you are on corticosteroid medication. SEEK IMMEDIATE MEDICAL CARE IF:   Your eyesight changes.  Pain  does not go away, even after taking pain medicine.  You feel pain in your chest.  Breathing is difficult.  One side of your face or body suddenly becomes weak or numb.  You develop a fever of more than 102 F (38.9 C). Document Released: 05/17/2009 Document Revised: 10/12/2011 Document Reviewed: 09/13/2013 [facility] Patient Information 2015 [place], [place]. This information is not intended to replace advice given to you by your health care provider. Make sure you discuss any questions you have with your health care provider.

## 2015-01-29 LAB — HIGH SENSITIVITY CRP: CRP HIGH SENSITIVITY: 1.16 mg/L (ref 0.00–3.00)

## 2015-01-29 LAB — SEDIMENTATION RATE: Sed Rate: 2 mm/hr (ref 0–40)

## 2015-01-31 ENCOUNTER — Telehealth: Payer: Self-pay | Admitting: Family

## 2015-01-31 NOTE — Telephone Encounter (Signed)
Notes under labs

## 2015-02-01 ENCOUNTER — Telehealth: Payer: Self-pay | Admitting: Family

## 2015-02-01 MED ORDER — AMOXICILLIN 875 MG PO TABS: 875.0000 mg | ORAL_TABLET | Freq: Two times a day (BID) | ORAL | Status: DC

## 2015-02-01 NOTE — Telephone Encounter (Signed)
Changed augmentin to amoxicillin- only thing can do for pain is OTC decongestant and ibuprofen

## 2015-02-01 NOTE — Telephone Encounter (Signed)
Aware of change in medications. 

## 2015-02-01 NOTE — Telephone Encounter (Signed)
Given Augmentin on 01/28/15 for ear infection and sinusitis.  Complains of sharp pain in ear and diarrhea.  Recommendation?

## 2015-02-06 ENCOUNTER — Ambulatory Visit: Payer: Medicare Other | Admitting: Family

## 2015-04-04 ENCOUNTER — Encounter: Payer: Medicare Other | Admitting: Family

## 2015-04-05 ENCOUNTER — Ambulatory Visit (INDEPENDENT_AMBULATORY_CARE_PROVIDER_SITE_OTHER): Payer: Medicare Other | Admitting: Family

## 2015-04-05 ENCOUNTER — Encounter: Payer: Self-pay | Admitting: Family

## 2015-04-05 VITALS — BP 118/82 | HR 77 | Temp 97.6°F | Ht 63.0 in | Wt 124.4 lb

## 2015-04-05 DIAGNOSIS — Z0000 Encounter for general adult medical examination without abnormal findings: Secondary | ICD-10-CM

## 2015-04-05 DIAGNOSIS — E559 Vitamin D deficiency, unspecified: Secondary | ICD-10-CM | POA: Diagnosis not present

## 2015-04-05 DIAGNOSIS — L298 Other pruritus: Secondary | ICD-10-CM

## 2015-04-05 DIAGNOSIS — I2510 Atherosclerotic heart disease of native coronary artery without angina pectoris: Secondary | ICD-10-CM

## 2015-04-05 DIAGNOSIS — M810 Age-related osteoporosis without current pathological fracture: Secondary | ICD-10-CM | POA: Diagnosis not present

## 2015-04-05 DIAGNOSIS — Z23 Encounter for immunization: Secondary | ICD-10-CM | POA: Diagnosis not present

## 2015-04-05 DIAGNOSIS — E785 Hyperlipidemia, unspecified: Secondary | ICD-10-CM | POA: Diagnosis not present

## 2015-04-05 DIAGNOSIS — J449 Chronic obstructive pulmonary disease, unspecified: Secondary | ICD-10-CM

## 2015-04-05 DIAGNOSIS — N898 Other specified noninflammatory disorders of vagina: Secondary | ICD-10-CM

## 2015-04-05 DIAGNOSIS — B373 Candidiasis of vulva and vagina: Secondary | ICD-10-CM | POA: Diagnosis not present

## 2015-04-05 DIAGNOSIS — B3731 Acute candidiasis of vulva and vagina: Secondary | ICD-10-CM

## 2015-04-05 LAB — POCT WET PREP (WET MOUNT): KOH WET PREP POC: POSITIVE

## 2015-04-05 MED ORDER — FLUCONAZOLE 150 MG PO TABS: 150.0000 mg | ORAL_TABLET | ORAL | Status: DC

## 2015-04-05 NOTE — Progress Notes (Signed)
Subjective:    Patient ID: Christine Sandoval, female    DOB: 10/24/1940, 74 y.o.   MRN: [id number]   HPI Pt presents to the office today for CPE without Pap. Pt taking several herbal medications. Pt has hyperlipidemia, but states she is unable to tolerate statins.  Pt states she took statins, but had reactions with muscle weakness and restless leg syndrome. Pt states she is taking Red yeast Rice everyday. 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 Pt see's a cardiologists annually.  Pt denies any headache, palpitations, SOB, or edema at this time. Pt is complaining of mild vaginal pain while having sex. Pt states there is mild itchy, but denies any discharge. Pt states she just completed an antibiotic a few weeks ago.     Review of Systems  Constitutional: Negative.   HENT: Negative.   Eyes: Negative.   Respiratory: Negative.  Negative for shortness of breath.   Cardiovascular: Negative.  Negative for palpitations.  Gastrointestinal: Negative.   Endocrine: Negative.   Genitourinary: Negative.   Musculoskeletal: Negative.   Neurological: Negative.  Negative for headaches.  Hematological: Negative.   Psychiatric/Behavioral: Negative.   All other systems reviewed and are negative.      Objective:   Physical Exam  Constitutional: She is oriented to person, place, and time. She appears well-developed and well-nourished. No distress.  HENT:  Head: Normocephalic and atraumatic.  Right Ear: External ear normal.  Left Ear: External ear normal.  Nose: Nose normal.  Mouth/Throat: Oropharynx is clear and moist.  Eyes: Pupils are equal, round, and reactive to light.  Neck: Normal range of motion. Neck supple. No thyromegaly present.  Cardiovascular: Normal rate, regular rhythm, normal heart sounds and intact distal pulses.   No murmur heard. Pulmonary/Chest: Effort normal and breath sounds normal. No respiratory distress. She has no wheezes.  Abdominal: Soft. Bowel sounds are normal. She  exhibits no distension. There is no tenderness.  Musculoskeletal: Normal range of motion. She exhibits no edema or tenderness.  Neurological: She is alert and oriented to person, place, and time. She has normal reflexes. No cranial nerve deficit.  Skin: Skin is warm and dry.  Psychiatric: She has a normal mood and affect. Her behavior is normal. Judgment and thought content normal.  Vitals reviewed.   BP 118/82 mmHg  Pulse 77  Temp(Src) 97.6 F (36.4 C) (Oral)  Ht '5\' 3"'  (1.6 m)  Wt 124 lb 6.4 oz (56.427 kg)  BMI 22.04 kg/m2       Assessment & Plan:  1. Coronary artery disease involving native coronary artery of native heart without angina pectoris - CMP14+EGFR; Future - CBC with Differential/Platelet; Future  2. Chronic obstructive pulmonary disease, unspecified COPD, unspecified chronic bronchitis type - CMP14+EGFR; Future - CBC with Differential/Platelet; Future  3. Osteoporosis, postmenopausal - CMP14+EGFR; Future  4. Vitamin D deficiency - CMP14+EGFR; Future - Vit D  25 hydroxy (rtn osteoporosis monitoring); Future  5. HLD (hyperlipidemia) - CMP14+EGFR; Future - CBC with Differential/Platelet; Future - Lipid panel; Future  6. Laboratory examination ordered as part of a complete physical examination - CMP14+EGFR; Future - CBC with Differential/Platelet; Future - Thyroid Panel With TSH; Future - Vit D  25 hydroxy (rtn osteoporosis monitoring); Future - Lipid panel; Future  7. Vaginal itching - POCT Wet Prep [facility])  8. Vagina, candidiasis -Keep clean and dry -Cotton underwear - fluconazole (DIFLUCAN) 150 MG tablet; Take 1 tablet (150 mg total) by mouth every 3 (three) days.  Dispense: 2  tablet; Refill: 0   Continue all meds Labs ordered- Pt ate before visit- Will come in and get labs drawn within the next week Health Maintenance reviewed Diet and exercise encouraged RTO 1 year  Evelina Dun, FNP

## 2015-04-05 NOTE — Patient Instructions (Addendum)
Health Maintenance Adopting a healthy lifestyle and getting preventive care can go a long way to promote health and wellness. Talk with your health care provider about what schedule of regular examinations is right for you. This is a good chance for you to check in with your provider about disease prevention and staying healthy. In between checkups, there are plenty of things you can do on your own. Experts have done a lot of research about which lifestyle changes and preventive measures are most likely to keep you healthy. Ask your health care provider for more information. WEIGHT AND DIET  Eat a healthy diet  Be sure to include plenty of vegetables, fruits, low-fat dairy products, and lean protein.  Do not eat a lot of foods high in solid fats, added sugars, or salt.  Get regular exercise. This is one of the most important things you can do for your health.  Most adults should exercise for at least 150 minutes each week. The exercise should increase your heart rate and make you sweat (moderate-intensity exercise).  Most adults should also do strengthening exercises at least twice a week. This is in addition to the moderate-intensity exercise.  Maintain a healthy weight  Body mass index (BMI) is a measurement that can be used to identify possible weight problems. It estimates body fat based on height and weight. Your health care provider can help determine your BMI and help you achieve or maintain a healthy weight.  For females 25 years of age and older:   A BMI below 18.5 is considered underweight.  A BMI of 18.5 to 24.9 is normal.  A BMI of 25 to 29.9 is considered overweight.  A BMI of 30 and above is considered obese.  Watch levels of cholesterol and blood lipids  You should start having your blood tested for lipids and cholesterol at 72 years of age, then have this test every 5 years.  You may need to have your cholesterol levels checked more often if:  Your lipid or  cholesterol levels are high.  You are older than 72 years of age.  You are at high risk for heart disease.  CANCER SCREENING   Lung Cancer  Lung cancer screening is recommended for adults 97-92 years old who are at high risk for lung cancer because of a history of smoking.  A yearly low-dose CT scan of the lungs is recommended for people who:  Currently smoke.  Have quit within the past 15 years.  Have at least a 30-pack-year history of smoking. A pack year is smoking an average of one pack of cigarettes a day for 1 year.  Yearly screening should continue until it has been 15 years since you quit.  Yearly screening should stop if you develop a health problem that would prevent you from having lung cancer treatment.  Breast Cancer  Practice breast self-awareness. This means understanding how your breasts normally appear and feel.  It also means doing regular breast self-exams. Let your health care provider know about any changes, no matter how small.  If you are in your 20s or 30s, you should have a clinical breast exam (CBE) by a health care provider every 1-3 years as part of a regular health exam.  If you are 76 or older, have a CBE every year. Also consider having a breast X-ray (mammogram) every year.  If you have a family history of breast cancer, talk to your health care provider about genetic screening.  If you are  at high risk for breast cancer, talk to your health care provider about having an MRI and a mammogram every year.  Breast cancer gene (BRCA) assessment is recommended for women who have family members with BRCA-related cancers. BRCA-related cancers include:  Breast.  Ovarian.  Tubal.  Peritoneal cancers.  Results of the assessment will determine the need for genetic counseling and BRCA1 and BRCA2 testing. Cervical Cancer Routine pelvic examinations to screen for cervical cancer are no longer recommended for nonpregnant women who are considered low  risk for cancer of the pelvic organs (ovaries, uterus, and vagina) and who do not have symptoms. A pelvic examination may be necessary if you have symptoms including those associated with pelvic infections. Ask your health care provider if a screening pelvic exam is right for you.   The Pap test is the screening test for cervical cancer for women who are considered at risk.  If you had a hysterectomy for a problem that was not cancer or a condition that could lead to cancer, then you no longer need Pap tests.  If you are older than 65 years, and you have had normal Pap tests for the past 10 years, you no longer need to have Pap tests.  If you have had past treatment for cervical cancer or a condition that could lead to cancer, you need Pap tests and screening for cancer for at least 20 years after your treatment.  If you no longer get a Pap test, assess your risk factors if they change (such as having a new sexual partner). This can affect whether you should start being screened again.  Some women have medical problems that increase their chance of getting cervical cancer. If this is the case for you, your health care provider may recommend more frequent screening and Pap tests.  The human papillomavirus (HPV) test is another test that may be used for cervical cancer screening. The HPV test looks for the virus that can cause cell changes in the cervix. The cells collected during the Pap test can be tested for HPV.  The HPV test can be used to screen women 30 years of age and older. Getting tested for HPV can extend the interval between normal Pap tests from three to five years.  An HPV test also should be used to screen women of any age who have unclear Pap test results.  After 72 years of age, women should have HPV testing as often as Pap tests.  Colorectal Cancer  This type of cancer can be detected and often prevented.  Routine colorectal cancer screening usually begins at 72 years of  age and continues through 72 years of age.  Your health care provider may recommend screening at an earlier age if you have risk factors for colon cancer.  Your health care provider may also recommend using home test kits to check for hidden blood in the stool.  A small camera at the end of a tube can be used to examine your colon directly (sigmoidoscopy or colonoscopy). This is done to check for the earliest forms of colorectal cancer.  Routine screening usually begins at age 50.  Direct examination of the colon should be repeated every 5-10 years through 72 years of age. However, you may need to be screened more often if early forms of precancerous polyps or small growths are found. Skin Cancer  Check your skin from head to toe regularly.  Tell your health care provider about any new moles or changes in   moles, especially if there is a change in a mole's shape or color.  Also tell your health care provider if you have a mole that is larger than the size of a pencil eraser.  Always use sunscreen. Apply sunscreen liberally and repeatedly throughout the day.  Protect yourself by wearing long sleeves, pants, a wide-brimmed hat, and sunglasses whenever you are outside. HEART DISEASE, DIABETES, AND HIGH BLOOD PRESSURE   Have your blood pressure checked at least every 1-2 years. High blood pressure causes heart disease and increases the risk of stroke.  If you are between 75 years and 42 years old, ask your health care provider if you should take aspirin to prevent strokes.  Have regular diabetes screenings. This involves taking a blood sample to check your fasting blood sugar level.  If you are at a normal weight and have a low risk for diabetes, have this test once every three years after 72 years of age.  If you are overweight and have a high risk for diabetes, consider being tested at a younger age or more often. PREVENTING INFECTION  Hepatitis B  If you have a higher risk for  hepatitis B, you should be screened for this virus. You are considered at high risk for hepatitis B if:  You were born in a country where hepatitis B is common. Ask your health care provider which countries are considered high risk.  Your parents were born in a high-risk country, and you have not been immunized against hepatitis B (hepatitis B vaccine).  You have HIV or AIDS.  You use needles to inject street drugs.  You live with someone who has hepatitis B.  You have had sex with someone who has hepatitis B.  You get hemodialysis treatment.  You take certain medicines for conditions, including cancer, organ transplantation, and autoimmune conditions. Hepatitis C  Blood testing is recommended for:  Everyone born from [birth year] through 1963.  Anyone with known risk factors for hepatitis C. Sexually transmitted infections (STIs)  You should be screened for sexually transmitted infections (STIs) including gonorrhea and chlamydia if:  You are sexually active and are younger than 72 years of age.  You are older than 72 years of age and your health care provider tells you that you are at risk for this type of infection.  Your sexual activity has changed since you were last screened and you are at an increased risk for chlamydia or gonorrhea. Ask your health care provider if you are at risk.  If you do not have HIV, but are at risk, it may be recommended that you take a prescription medicine daily to prevent HIV infection. This is called pre-exposure prophylaxis (PrEP). You are considered at risk if:  You are sexually active and do not regularly use condoms or know the HIV status of your partner(s).  You take drugs by injection.  You are sexually active with a partner who has HIV. Talk with your health care provider about whether you are at high risk of being infected with HIV. If you choose to begin PrEP, you should first be tested for HIV. You should then be tested every 3 months for  as long as you are taking PrEP.  PREGNANCY   If you are premenopausal and you may become pregnant, ask your health care provider about preconception counseling.  If you may become pregnant, take 400 to 800 micrograms (mcg) of folic acid every day.  If you want to prevent pregnancy, talk to your  health care provider about birth control (contraception). OSTEOPOROSIS AND MENOPAUSE   Osteoporosis is a disease in which the bones lose minerals and strength with aging. This can result in serious bone fractures. Your risk for osteoporosis can be identified using a bone density scan.  If you are 65 years of age or older, or if you are at risk for osteoporosis and fractures, ask your health care provider if you should be screened.  Ask your health care provider whether you should take a calcium or vitamin D supplement to lower your risk for osteoporosis.  Menopause may have certain physical symptoms and risks.  Hormone replacement therapy may reduce some of these symptoms and risks. Talk to your health care provider about whether hormone replacement therapy is right for you.  HOME CARE INSTRUCTIONS   Schedule regular health, dental, and eye exams.  Stay current with your immunizations.   Do not use any tobacco products including cigarettes, chewing tobacco, or electronic cigarettes.  If you are pregnant, do not drink alcohol.  If you are breastfeeding, limit how much and how often you drink alcohol.  Limit alcohol intake to no more than 1 drink per day for nonpregnant women. One drink equals 12 ounces of beer, 5 ounces of wine, or 1 ounces of hard liquor.  Do not use street drugs.  Do not share needles.  Ask your health care provider for help if you need support or information about quitting drugs.  Tell your health care provider if you often feel depressed.  Tell your health care provider if you have ever been abused or do not feel safe at home. Document Released: 02/02/2011  Document Revised: 12/04/2013 Document Reviewed: 06/21/2013 ExitCare Patient Information 2015 [employer]. This information is not intended to replace advice given to you by your health care provider. Make sure you discuss any questions you have with your health care provider. Monilial Vaginitis Vaginitis in a soreness, swelling and redness (inflammation) of the vagina and vulva. Monilial vaginitis is not a sexually transmitted infection. CAUSES  Yeast vaginitis is caused by yeast (candida) that is normally found in your vagina. With a yeast infection, the candida has overgrown in number to a point that upsets the chemical balance. SYMPTOMS   White, thick vaginal discharge.  Swelling, itching, redness and irritation of the vagina and possibly the lips of the vagina (vulva).  Burning or painful urination.  Painful intercourse. DIAGNOSIS  Things that may contribute to monilial vaginitis are:  Postmenopausal and virginal states.  Pregnancy.  Infections.  Being tired, sick or stressed, especially if you had monilial vaginitis in the past.  Diabetes. Good control will help lower the chance.  Birth control pills.  Tight fitting garments.  Using bubble bath, feminine sprays, douches or deodorant tampons.  Taking certain medications that kill germs (antibiotics).  Sporadic recurrence can occur if you become ill. TREATMENT  Your caregiver will give you medication.  There are several kinds of anti monilial vaginal creams and suppositories specific for monilial vaginitis. For recurrent yeast infections, use a suppository or cream in the vagina 2 times a week, or as directed.  Anti-monilial or steroid cream for the itching or irritation of the vulva may also be used. Get your caregiver's permission.  Painting the vagina with methylene blue solution may help if the monilial cream does not work.  Eating yogurt may help prevent monilial vaginitis. HOME CARE INSTRUCTIONS   Finish  all medication as prescribed.  Do not have sex until treatment is   completed or after your caregiver tells you it is okay.  Take warm sitz baths.  Do not douche.  Do not use tampons, especially scented ones.  Wear cotton underwear.  Avoid tight pants and panty hose.  Tell your sexual partner that you have a yeast infection. They should go to their caregiver if they have symptoms such as mild rash or itching.  Your sexual partner should be treated as well if your infection is difficult to eliminate.  Practice safer sex. Use condoms.  Some vaginal medications cause latex condoms to fail. Vaginal medications that harm condoms are:  Cleocin cream.  Butoconazole (Femstat).  Terconazole (Terazol) vaginal suppository.  Miconazole (Monistat) (may be purchased over the counter). SEEK MEDICAL CARE IF:   You have a temperature by mouth above 102 F (38.9 C).  The infection is getting worse after 2 days of treatment.  The infection is not getting better after 3 days of treatment.  You develop blisters in or around your vagina.  You develop vaginal bleeding, and it is not your menstrual period.  You have pain when you urinate.  You develop intestinal problems.  You have pain with sexual intercourse. Document Released: 04/29/2005 Document Revised: 10/12/2011 Document Reviewed: 01/11/2009 ExitCare Patient Information 2015 [employer]. This information is not intended to replace advice given to you by your health care provider. Make sure you discuss any questions you have with your health care provider.  

## 2015-05-09 ENCOUNTER — Encounter: Payer: Self-pay | Admitting: Nurse Practitioner

## 2015-05-09 ENCOUNTER — Encounter (INDEPENDENT_AMBULATORY_CARE_PROVIDER_SITE_OTHER): Payer: Self-pay

## 2015-05-09 ENCOUNTER — Ambulatory Visit (INDEPENDENT_AMBULATORY_CARE_PROVIDER_SITE_OTHER): Payer: Medicare Other | Admitting: Nurse Practitioner

## 2015-05-09 VITALS — BP 124/76 | HR 68 | Temp 96.8°F | Ht 63.0 in | Wt 124.0 lb

## 2015-05-09 DIAGNOSIS — I2510 Atherosclerotic heart disease of native coronary artery without angina pectoris: Secondary | ICD-10-CM | POA: Diagnosis not present

## 2015-05-09 DIAGNOSIS — N362 Urethral caruncle: Secondary | ICD-10-CM | POA: Diagnosis not present

## 2015-05-09 NOTE — Progress Notes (Signed)
   Subjective:    Patient ID: Christine Sandoval, female    DOB: 02-09-1941, 75 y.o.   MRN: [id number]  Pelvic Pain The patient's primary symptoms include a genital odor and pelvic pain. The patient's pertinent negatives include no genital itching, genital lesions, genital rash or vaginal discharge. This is a recurrent problem. The current episode started more than 1 month ago. The problem occurs daily. The problem has been gradually improving. The pain is moderate. The problem affects the right side. She is not pregnant. Pertinent negatives include no abdominal pain, fever, frequency or hematuria. Nothing aggravates the symptoms. She has tried nothing for the symptoms. She is sexually active. No, her partner does not have an STD. She is postmenopausal. There is no history of endometriosis, herpes simplex, PID or an STD.   * Saw C. Hawks 1 month ago and was diagnosed with yeast and was treated.   Review of Systems  Constitutional: Negative for fever.  Gastrointestinal: Negative for abdominal pain.  Genitourinary: Positive for pelvic pain. Negative for frequency, hematuria and vaginal discharge.  All other systems reviewed and are negative.      Objective:   Physical Exam  Constitutional: She is oriented to person, place, and time. She appears well-developed and well-nourished. No distress.  Cardiovascular: Normal rate, regular rhythm and normal heart sounds.   Pulmonary/Chest: Effort normal and breath sounds normal.  Genitourinary:  Vaginal mucosa normal- cervix parous and pink- no uterine prolapse- small polyp from urethral opening  Neurological: She is alert and oriented to person, place, and time.  Skin: Skin is warm and dry.  Psychiatric: She has a normal mood and affect. Her behavior is normal. Judgment and thought content normal.   BP 124/76 mmHg  Pulse 68  Temp(Src) 96.8 F (36 C) (Oral)  Ht 5\' 3"  (1.6 m)  Wt 124 lb (56.246 kg)  BMI 21.97 kg/m2        Assessment & Plan:  1.  Urethral polyp Just watch area until appointment - Ambulatory referral to Urology   [place], [place]

## 2015-05-21 DIAGNOSIS — D219 Benign neoplasm of connective and other soft tissue, unspecified: Secondary | ICD-10-CM | POA: Diagnosis not present

## 2015-05-21 DIAGNOSIS — Z85828 Personal history of other malignant neoplasm of skin: Secondary | ICD-10-CM | POA: Diagnosis not present

## 2015-09-10 DIAGNOSIS — Z961 Presence of intraocular lens: Secondary | ICD-10-CM | POA: Diagnosis not present

## 2015-09-10 DIAGNOSIS — H43393 Other vitreous opacities, bilateral: Secondary | ICD-10-CM | POA: Diagnosis not present

## 2015-09-10 DIAGNOSIS — H04123 Dry eye syndrome of bilateral lacrimal glands: Secondary | ICD-10-CM | POA: Diagnosis not present

## 2015-09-10 DIAGNOSIS — H43813 Vitreous degeneration, bilateral: Secondary | ICD-10-CM | POA: Diagnosis not present

## 2015-10-14 ENCOUNTER — Telehealth: Payer: Self-pay | Admitting: Nurse Practitioner

## 2016-01-20 ENCOUNTER — Encounter: Payer: Self-pay | Admitting: Internal Medicine

## 2016-02-01 ENCOUNTER — Ambulatory Visit (INDEPENDENT_AMBULATORY_CARE_PROVIDER_SITE_OTHER): Payer: Medicare Other | Admitting: Nurse Practitioner

## 2016-02-01 ENCOUNTER — Encounter: Payer: Self-pay | Admitting: Nurse Practitioner

## 2016-02-01 VITALS — BP 131/80 | HR 72 | Temp 97.3°F | Ht 63.0 in | Wt 124.6 lb

## 2016-02-01 DIAGNOSIS — W57XXXA Bitten or stung by nonvenomous insect and other nonvenomous arthropods, initial encounter: Secondary | ICD-10-CM | POA: Diagnosis not present

## 2016-02-01 DIAGNOSIS — S40922A Unspecified superficial injury of left upper arm, initial encounter: Secondary | ICD-10-CM | POA: Diagnosis not present

## 2016-02-01 MED ORDER — SULFAMETHOXAZOLE-TRIMETHOPRIM 800-160 MG PO TABS: 1.0000 | ORAL_TABLET | Freq: Two times a day (BID) | ORAL | Status: DC

## 2016-02-01 NOTE — Progress Notes (Signed)
   Subjective:    Patient ID: Hollee Lamberton, female    DOB: 1940-11-10, 75 y.o.   MRN: [id number]  HPI Patient comes in today c/o insect bite. SHe noticed it 1 week ago- now area is turning black and red.No drainage- painful to touch.     Review of Systems  Constitutional: Negative.   HENT: Negative.   Respiratory: Negative.   Cardiovascular: Negative.   Genitourinary: Negative.   Neurological: Negative.   Psychiatric/Behavioral: Negative.   All other systems reviewed and are negative.      Objective:   Physical Exam  Constitutional: She is oriented to person, place, and time. She appears well-developed and well-nourished.  Cardiovascular: Normal rate, regular rhythm and normal heart sounds.   Pulmonary/Chest: Effort normal and breath sounds normal.  Neurological: She is alert and oriented to person, place, and time.  Skin: Skin is warm.  Purplish vesicular patchy lesion about 3cm annular with surrounding erythema.- left upper arm   BP 131/80 mmHg  Pulse 72  Temp(Src) 97.3 F (36.3 C) (Oral)  Ht 5\' 3"  (1.6 m)  Wt 124 lb 9.6 oz (56.518 kg)  BMI 22.08 kg/m2       Assessment & Plan:   1. Insect bite    Meds ordered this encounter  Medications  . sulfamethoxazole-trimethoprim (BACTRIM DS) 800-160 MG tablet    Sig: Take 1 tablet by mouth 2 (two) times daily.    Dispense:  14 tablet    Refill:  0    Order Specific Question:  Supervising Provider    Answer:  Chipper Herb [[id number]]   Keep clean and dry Do not pick at area RTO prn  Mary-Margaret Hassell Done, FNP

## 2016-02-01 NOTE — Patient Instructions (Signed)

## 2016-02-25 ENCOUNTER — Telehealth: Payer: Self-pay | Admitting: Nurse Practitioner

## 2016-02-25 NOTE — Telephone Encounter (Signed)
Scheduled

## 2016-03-06 ENCOUNTER — Encounter: Payer: Self-pay | Admitting: Internal Medicine

## 2016-03-09 ENCOUNTER — Encounter: Payer: Self-pay | Admitting: Nurse Practitioner

## 2016-03-09 ENCOUNTER — Ambulatory Visit (INDEPENDENT_AMBULATORY_CARE_PROVIDER_SITE_OTHER): Payer: Medicare Other

## 2016-03-09 ENCOUNTER — Ambulatory Visit (INDEPENDENT_AMBULATORY_CARE_PROVIDER_SITE_OTHER): Payer: Medicare Other | Admitting: Nurse Practitioner

## 2016-03-09 VITALS — BP 122/80 | Temp 97.6°F | Ht 63.0 in | Wt 126.4 lb

## 2016-03-09 DIAGNOSIS — J449 Chronic obstructive pulmonary disease, unspecified: Secondary | ICD-10-CM

## 2016-03-09 DIAGNOSIS — R6889 Other general symptoms and signs: Secondary | ICD-10-CM | POA: Diagnosis not present

## 2016-03-09 DIAGNOSIS — E559 Vitamin D deficiency, unspecified: Secondary | ICD-10-CM

## 2016-03-09 DIAGNOSIS — I2510 Atherosclerotic heart disease of native coronary artery without angina pectoris: Secondary | ICD-10-CM

## 2016-03-09 DIAGNOSIS — Z01419 Encounter for gynecological examination (general) (routine) without abnormal findings: Secondary | ICD-10-CM | POA: Diagnosis not present

## 2016-03-09 DIAGNOSIS — I201 Angina pectoris with documented spasm: Secondary | ICD-10-CM | POA: Diagnosis not present

## 2016-03-09 DIAGNOSIS — M810 Age-related osteoporosis without current pathological fracture: Secondary | ICD-10-CM | POA: Diagnosis not present

## 2016-03-09 DIAGNOSIS — E785 Hyperlipidemia, unspecified: Secondary | ICD-10-CM

## 2016-03-09 NOTE — Addendum Note (Signed)
Addended by: Chevis Pretty on: 03/09/2016 11:26 AM   Modules accepted: Orders

## 2016-03-09 NOTE — Patient Instructions (Signed)
Health Maintenance, Female Adopting a healthy lifestyle and getting preventive care can go a long way to promote health and wellness. Talk with your health care provider about what schedule of regular examinations is right for you. This is a good chance for you to check in with your provider about disease prevention and staying healthy. In between checkups, there are plenty of things you can do on your own. Experts have done a lot of research about which lifestyle changes and preventive measures are most likely to keep you healthy. Ask your health care provider for more information. WEIGHT AND DIET  Eat a healthy diet  Be sure to include plenty of vegetables, fruits, low-fat dairy products, and lean protein.  Do not eat a lot of foods high in solid fats, added sugars, or salt.  Get regular exercise. This is one of the most important things you can do for your health.  Most adults should exercise for at least 150 minutes each week. The exercise should increase your heart rate and make you sweat (moderate-intensity exercise).  Most adults should also do strengthening exercises at least twice a week. This is in addition to the moderate-intensity exercise.  Maintain a healthy weight  Body mass index (BMI) is a measurement that can be used to identify possible weight problems. It estimates body fat based on height and weight. Your health care provider can help determine your BMI and help you achieve or maintain a healthy weight.  For females 20 years of age and older:   A BMI below 18.5 is considered underweight.  A BMI of 18.5 to 24.9 is normal.  A BMI of 25 to 29.9 is considered overweight.  A BMI of 30 and above is considered obese.  Watch levels of cholesterol and blood lipids  You should start having your blood tested for lipids and cholesterol at 73 years of age, then have this test every 5 years.  You may need to have your cholesterol levels checked more often if:  Your lipid  or cholesterol levels are high.  You are older than 73 years of age.  You are at high risk for heart disease.  CANCER SCREENING   Lung Cancer  Lung cancer screening is recommended for adults 55-80 years old who are at high risk for lung cancer because of a history of smoking.  A yearly low-dose CT scan of the lungs is recommended for people who:  Currently smoke.  Have quit within the past 15 years.  Have at least a 30-pack-year history of smoking. A pack year is smoking an average of one pack of cigarettes a day for 1 year.  Yearly screening should continue until it has been 15 years since you quit.  Yearly screening should stop if you develop a health problem that would prevent you from having lung cancer treatment.  Breast Cancer  Practice breast self-awareness. This means understanding how your breasts normally appear and feel.  It also means doing regular breast self-exams. Let your health care provider know about any changes, no matter how small.  If you are in your 20s or 30s, you should have a clinical breast exam (CBE) by a health care provider every 1-3 years as part of a regular health exam.  If you are 40 or older, have a CBE every year. Also consider having a breast X-ray (mammogram) every year.  If you have a family history of breast cancer, talk to your health care provider about genetic screening.  If you   are at high risk for breast cancer, talk to your health care provider about having an MRI and a mammogram every year.  Breast cancer gene (BRCA) assessment is recommended for women who have family members with BRCA-related cancers. BRCA-related cancers include:  Breast.  Ovarian.  Tubal.  Peritoneal cancers.  Results of the assessment will determine the need for genetic counseling and BRCA1 and BRCA2 testing. Cervical Cancer Your health care provider may recommend that you be screened regularly for cancer of the pelvic organs (ovaries, uterus, and  vagina). This screening involves a pelvic examination, including checking for microscopic changes to the surface of your cervix (Pap test). You may be encouraged to have this screening done every 3 years, beginning at age 21.  For women ages 30-65, health care providers may recommend pelvic exams and Pap testing every 3 years, or they may recommend the Pap and pelvic exam, combined with testing for human papilloma virus (HPV), every 5 years. Some types of HPV increase your risk of cervical cancer. Testing for HPV may also be done on women of any age with unclear Pap test results.  Other health care providers may not recommend any screening for nonpregnant women who are considered low risk for pelvic cancer and who do not have symptoms. Ask your health care provider if a screening pelvic exam is right for you.  If you have had past treatment for cervical cancer or a condition that could lead to cancer, you need Pap tests and screening for cancer for at least 20 years after your treatment. If Pap tests have been discontinued, your risk factors (such as having a new sexual partner) need to be reassessed to determine if screening should resume. Some women have medical problems that increase the chance of getting cervical cancer. In these cases, your health care provider may recommend more frequent screening and Pap tests. Colorectal Cancer  This type of cancer can be detected and often prevented.  Routine colorectal cancer screening usually begins at 73 years of age and continues through 73 years of age.  Your health care provider may recommend screening at an earlier age if you have risk factors for colon cancer.  Your health care provider may also recommend using home test kits to check for hidden blood in the stool.  A small camera at the end of a tube can be used to examine your colon directly (sigmoidoscopy or colonoscopy). This is done to check for the earliest forms of colorectal  cancer.  Routine screening usually begins at age 50.  Direct examination of the colon should be repeated every 5-10 years through 73 years of age. However, you may need to be screened more often if early forms of precancerous polyps or small growths are found. Skin Cancer  Check your skin from head to toe regularly.  Tell your health care provider about any new moles or changes in moles, especially if there is a change in a mole's shape or color.  Also tell your health care provider if you have a mole that is larger than the size of a pencil eraser.  Always use sunscreen. Apply sunscreen liberally and repeatedly throughout the day.  Protect yourself by wearing long sleeves, pants, a wide-brimmed hat, and sunglasses whenever you are outside. HEART DISEASE, DIABETES, AND HIGH BLOOD PRESSURE   High blood pressure causes heart disease and increases the risk of stroke. High blood pressure is more likely to develop in:  People who have blood pressure in the high end   of the normal range (130-139/85-89 mm Hg).  People who are overweight or obese.  People who are African American.  If you are 38-23 years of age, have your blood pressure checked every 3-5 years. If you are 61 years of age or older, have your blood pressure checked every year. You should have your blood pressure measured twice--once when you are at a hospital or clinic, and once when you are not at a hospital or clinic. Record the average of the two measurements. To check your blood pressure when you are not at a hospital or clinic, you can use:  An automated blood pressure machine at a pharmacy.  A home blood pressure monitor.  If you are between 45 years and 39 years old, ask your health care provider if you should take aspirin to prevent strokes.  Have regular diabetes screenings. This involves taking a blood sample to check your fasting blood sugar level.  If you are at a normal weight and have a low risk for diabetes,  have this test once every three years after 73 years of age.  If you are overweight and have a high risk for diabetes, consider being tested at a younger age or more often. PREVENTING INFECTION  Hepatitis B  If you have a higher risk for hepatitis B, you should be screened for this virus. You are considered at high risk for hepatitis B if:  You were born in a country where hepatitis B is common. Ask your health care provider which countries are considered high risk.  Your parents were born in a high-risk country, and you have not been immunized against hepatitis B (hepatitis B vaccine).  You have HIV or AIDS.  You use needles to inject street drugs.  You live with someone who has hepatitis B.  You have had sex with someone who has hepatitis B.  You get hemodialysis treatment.  You take certain medicines for conditions, including cancer, organ transplantation, and autoimmune conditions. Hepatitis C  Blood testing is recommended for:  Everyone born from [birth year] through 1963.  Anyone with known risk factors for hepatitis C. Sexually transmitted infections (STIs)  You should be screened for sexually transmitted infections (STIs) including gonorrhea and chlamydia if:  You are sexually active and are younger than 73 years of age.  You are older than 73 years of age and your health care provider tells you that you are at risk for this type of infection.  Your sexual activity has changed since you were last screened and you are at an increased risk for chlamydia or gonorrhea. Ask your health care provider if you are at risk.  If you do not have HIV, but are at risk, it may be recommended that you take a prescription medicine daily to prevent HIV infection. This is called pre-exposure prophylaxis (PrEP). You are considered at risk if:  You are sexually active and do not regularly use condoms or know the HIV status of your partner(s).  You take drugs by injection.  You are sexually  active with a partner who has HIV. Talk with your health care provider about whether you are at high risk of being infected with HIV. If you choose to begin PrEP, you should first be tested for HIV. You should then be tested every 3 months for as long as you are taking PrEP.  PREGNANCY   If you are premenopausal and you may become pregnant, ask your health care provider about preconception counseling.  If you may  become pregnant, take 400 to 800 micrograms (mcg) of folic acid every day.  If you want to prevent pregnancy, talk to your health care provider about birth control (contraception). OSTEOPOROSIS AND MENOPAUSE   Osteoporosis is a disease in which the bones lose minerals and strength with aging. This can result in serious bone fractures. Your risk for osteoporosis can be identified using a bone density scan.  If you are 61 years of age or older, or if you are at risk for osteoporosis and fractures, ask your health care provider if you should be screened.  Ask your health care provider whether you should take a calcium or vitamin D supplement to lower your risk for osteoporosis.  Menopause may have certain physical symptoms and risks.  Hormone replacement therapy may reduce some of these symptoms and risks. Talk to your health care provider about whether hormone replacement therapy is right for you.  HOME CARE INSTRUCTIONS   Schedule regular health, dental, and eye exams.  Stay current with your immunizations.   Do not use any tobacco products including cigarettes, chewing tobacco, or electronic cigarettes.  If you are pregnant, do not drink alcohol.  If you are breastfeeding, limit how much and how often you drink alcohol.  Limit alcohol intake to no more than 1 drink per day for nonpregnant women. One drink equals 12 ounces of beer, 5 ounces of wine, or 1 ounces of hard liquor.  Do not use street drugs.  Do not share needles.  Ask your health care provider for help if  you need support or information about quitting drugs.  Tell your health care provider if you often feel depressed.  Tell your health care provider if you have ever been abused or do not feel safe at home.   This information is not intended to replace advice given to you by your health care provider. Make sure you discuss any questions you have with your health care provider.   Document Released: 02/02/2011 Document Revised: 08/10/2014 Document Reviewed: 06/21/2013 Elsevier Interactive Patient Education [employer].

## 2016-03-09 NOTE — Progress Notes (Addendum)
Subjective:    Patient ID: Christine Sandoval, female    DOB: 1940-12-24, 75 y.o.   MRN: [id number]  Patient here today for CPE, PAP and  follow up of chronic medical problems. She is currenlty on no rx medications. She has no complaints today.  Outpatient Encounter Prescriptions as of 03/09/2016  Medication Sig  . Arginine 500 MG CAPS Take 2 capsules by mouth daily. L-Arginine  . Ascorbic Acid (VITAMIN C) 1000 MG tablet Take 2 tablet a day  . aspirin 81 MG tablet Take 162 mg by mouth daily.   . B Complex Vitamins (B-COMPLEX/B-12 PO) Take 1 capsule by mouth daily.    . Calcium Citrate-Vitamin D (CALCIUM CITRATE + D PO) Take 500 mg by mouth.  . Cholecalciferol (VITAMIN D) 1000 UNITS capsule Take 2,000 Units by mouth daily.   . Coenzyme Q10 (CO Q 10) 100 MG CAPS Take 1 capsule by mouth daily.  . fish oil-omega-3 fatty acids 1000 MG capsule Take 1 g by mouth daily.   Nyoka Cowden Tea, Camillia sinensis, ([employer] SUPER GREEN TEA EXTRACT) 250 MG CAPS Take 1 capsule by mouth 2 times daily at 12 noon and 4 pm. Takes 350 egcg bid  . L-Lysine 500 MG CAPS Take 1 capsule by mouth daily.  . Magnesium 400 MG TABS Take 1 tablet by mouth at bedtime.  . Multiple Vitamin (MULTIVITAMIN) capsule Take 1 capsule by mouth daily.    . nitroGLYCERIN (NITROSTAT) 0.4 MG SL tablet Place 1 tablet (0.4 mg total) under the tongue every 5 (five) minutes as needed for chest pain.  . Red Yeast Rice Extract (RED YEAST RICE PO) Take 1 capsule by mouth 2 (two) times daily.     Hyperlipidemia  This is a chronic problem. The current episode started more than 1 year ago. Recent lipid tests were reviewed and are variable. Exacerbating diseases include obesity. She has no history of diabetes or hypothyroidism. There are no known factors aggravating her hyperlipidemia. Current antihyperlipidemic treatment includes diet change. The current treatment provides moderate improvement of lipids. Compliance problems include adherence to diet and  adherence to exercise.  Risk factors for coronary artery disease include obesity and post-menopausal.  CAD  Has not seen cardiologist in over 2 years- denies and chest pan or shortness of breath. osteoporosis Tries weigh bearing exercise 2-3 x a week. Vitamin d def Vitamin d OTC daily     Review of Systems  Constitutional: Negative.   HENT: Negative.   Respiratory: Negative.   Cardiovascular: Negative.   Gastrointestinal: Negative.   Genitourinary: Negative.   Neurological: Negative.   Psychiatric/Behavioral: Negative.   All other systems reviewed and are negative.      Objective:   Physical Exam  Constitutional: She is oriented to person, place, and time. She appears well-developed and well-nourished.  HENT:  Head: Normocephalic.  Right Ear: Hearing, tympanic membrane, external ear and ear canal normal.  Left Ear: Hearing, tympanic membrane, external ear and ear canal normal.  Nose: Nose normal.  Mouth/Throat: Uvula is midline and oropharynx is clear and moist.  Eyes: Conjunctivae and EOM are normal. Pupils are equal, round, and reactive to light.  Neck: Normal range of motion and full passive range of motion without pain. Neck supple. No JVD present. Carotid bruit is not present. No thyroid mass and no thyromegaly present.  Cardiovascular: Normal rate, normal heart sounds and intact distal pulses.   No murmur heard. Pulmonary/Chest: Effort normal and breath sounds normal. Right breast exhibits no inverted  nipple, no mass, no nipple discharge, no skin change and no tenderness. Left breast exhibits no inverted nipple, no mass, no nipple discharge, no skin change and no tenderness.  Abdominal: Soft. Bowel sounds are normal. She exhibits no mass. There is no tenderness.  Genitourinary: Uterus normal. No breast swelling, tenderness, discharge or bleeding. No vaginal discharge found.  Genitourinary Comments: bimanual exam-No adnexal masses or tenderness. Cervix parous and pink     Musculoskeletal: Normal range of motion.  Lymphadenopathy:    She has no cervical adenopathy.  Neurological: She is alert and oriented to person, place, and time.  Skin: Skin is warm and dry.  Psychiatric: She has a normal mood and affect. Her behavior is normal. Judgment and thought content normal.   BP 122/80 (BP Location: Right Arm, Patient Position: Sitting, Cuff Size: Normal)   Temp 97.6 F (36.4 C) (Oral)   Ht '5\' 3"'  (1.6 m)   Wt 126 lb 6.4 oz (57.3 kg)   BMI 22.39 kg/m         Assessment & Plan:  1. Coronary vasospasm ([place]) Follow up with cardiologist  2. Coronary artery disease involving native coronary artery of native heart without angina pectoris - CBC with Differential/Platelet - CMP14+EGFR - Lipid panel - Thyroid Panel With TSH - DG Chest 2 View; Future  3. Chronic obstructive pulmonary disease, unspecified COPD type ([place])  4. HLD (hyperlipidemia) Low fat diet  5. Vitamin D deficiency - VITAMIN D 25 Hydroxy (Vit-D Deficiency, Fractures)  6. Encounter for routine gynecological examination - Pap IG (Image Guided)    Labs pending Health maintenance reviewed Diet and exercise encouraged Continue all meds Follow up  In 6 month   [place], FNP

## 2016-03-10 ENCOUNTER — Other Ambulatory Visit: Payer: Medicare Other

## 2016-03-11 ENCOUNTER — Telehealth: Payer: Self-pay | Admitting: Nurse Practitioner

## 2016-03-11 LAB — CBC WITH DIFFERENTIAL/PLATELET
BASOS ABS: 0.1 10*3/uL (ref 0.0–0.2)
Basos: 1 %
EOS (ABSOLUTE): 0.1 10*3/uL (ref 0.0–0.4)
Eos: 1 %
HEMOGLOBIN: 14.6 g/dL (ref 11.1–15.9)
Hematocrit: 43.5 % (ref 34.0–46.6)
IMMATURE GRANS (ABS): 0 10*3/uL (ref 0.0–0.1)
IMMATURE GRANULOCYTES: 0 %
LYMPHS: 30 %
Lymphocytes Absolute: 1.8 10*3/uL (ref 0.7–3.1)
MCH: 30.8 pg (ref 26.6–33.0)
MCHC: 33.6 g/dL (ref 31.5–35.7)
MCV: 92 fL (ref 79–97)
MONOCYTES: 8 %
Monocytes Absolute: 0.5 10*3/uL (ref 0.1–0.9)
NEUTROS PCT: 60 %
Neutrophils Absolute: 3.6 10*3/uL (ref 1.4–7.0)
PLATELETS: 291 10*3/uL (ref 150–379)
RBC: 4.74 x10E6/uL (ref 3.77–5.28)
RDW: 14.3 % (ref 12.3–15.4)
WBC: 6 10*3/uL (ref 3.4–10.8)

## 2016-03-11 LAB — THYROID PANEL WITH TSH
FREE THYROXINE INDEX: 1.8 (ref 1.2–4.9)
T3 UPTAKE RATIO: 25 % (ref 24–39)
T4 TOTAL: 7.2 ug/dL (ref 4.5–12.0)
TSH: 5.37 u[IU]/mL — ABNORMAL HIGH (ref 0.450–4.500)

## 2016-03-11 LAB — CMP14+EGFR
A/G RATIO: 2 (ref 1.2–2.2)
ALT: 20 IU/L (ref 0–32)
AST: 25 IU/L (ref 0–40)
Albumin: 4.4 g/dL (ref 3.5–4.8)
Alkaline Phosphatase: 76 IU/L (ref 39–117)
BUN/Creatinine Ratio: 20 (ref 12–28)
BUN: 17 mg/dL (ref 8–27)
Bilirubin Total: 0.3 mg/dL (ref 0.0–1.2)
CO2: 27 mmol/L (ref 18–29)
CREATININE: 0.87 mg/dL (ref 0.57–1.00)
Calcium: 9.9 mg/dL (ref 8.7–10.3)
Chloride: 104 mmol/L (ref 96–106)
GFR, EST AFRICAN AMERICAN: 77 mL/min/{1.73_m2} (ref >59)
GFR, EST NON AFRICAN AMERICAN: 67 mL/min/{1.73_m2} (ref >59)
GLUCOSE: 97 mg/dL (ref 65–99)
Globulin, Total: 2.2 g/dL (ref 1.5–4.5)
POTASSIUM: 6.5 mmol/L — AB (ref 3.5–5.2)
Sodium: 145 mmol/L — ABNORMAL HIGH (ref 134–144)
TOTAL PROTEIN: 6.6 g/dL (ref 6.0–8.5)

## 2016-03-11 LAB — LIPID PANEL
CHOL/HDL RATIO: 3.9 ratio (ref 0.0–4.4)
Cholesterol, Total: 277 mg/dL — ABNORMAL HIGH (ref 100–199)
HDL: 71 mg/dL (ref >39)
LDL CALC: 170 mg/dL — AB (ref 0–99)
Triglycerides: 178 mg/dL — ABNORMAL HIGH (ref 0–149)
VLDL CHOLESTEROL CAL: 36 mg/dL (ref 5–40)

## 2016-03-11 LAB — PAP IG (IMAGE GUIDED): PAP Smear Comment: 0

## 2016-03-11 LAB — VITAMIN D 25 HYDROXY (VIT D DEFICIENCY, FRACTURES): Vit D, 25-Hydroxy: 48.2 ng/mL (ref 30.0–100.0)

## 2016-03-12 ENCOUNTER — Other Ambulatory Visit: Payer: Medicare Other

## 2016-03-12 ENCOUNTER — Other Ambulatory Visit: Payer: Self-pay | Admitting: Family

## 2016-03-12 ENCOUNTER — Other Ambulatory Visit: Payer: Self-pay

## 2016-03-12 DIAGNOSIS — E875 Hyperkalemia: Secondary | ICD-10-CM

## 2016-03-12 MED ORDER — LEVOTHYROXINE SODIUM 50 MCG PO TABS: 50.0000 ug | ORAL_TABLET | Freq: Every day | ORAL | 3 refills | Status: DC

## 2016-03-12 NOTE — Telephone Encounter (Signed)
Patient notified of critical potassium and sodium level. Stated that she would come back in today for lab redraw

## 2016-03-13 LAB — CMP14+EGFR
A/G RATIO: 1.7 (ref 1.2–2.2)
ALBUMIN: 4.3 g/dL (ref 3.5–4.8)
ALK PHOS: 75 IU/L (ref 39–117)
ALT: 21 IU/L (ref 0–32)
AST: 41 IU/L — ABNORMAL HIGH (ref 0–40)
BILIRUBIN TOTAL: 0.3 mg/dL (ref 0.0–1.2)
BUN / CREAT RATIO: 22 (ref 12–28)
BUN: 20 mg/dL (ref 8–27)
CHLORIDE: 100 mmol/L (ref 96–106)
CO2: 17 mmol/L — ABNORMAL LOW (ref 18–29)
Calcium: 9.7 mg/dL (ref 8.7–10.3)
Creatinine, Ser: 0.89 mg/dL (ref 0.57–1.00)
GFR calc non Af Amer: 65 mL/min/{1.73_m2} (ref >59)
GFR, EST AFRICAN AMERICAN: 75 mL/min/{1.73_m2} (ref >59)
GLUCOSE: 82 mg/dL (ref 65–99)
Globulin, Total: 2.6 g/dL (ref 1.5–4.5)
POTASSIUM: 5.4 mmol/L — AB (ref 3.5–5.2)
Sodium: 141 mmol/L (ref 134–144)
Total Protein: 6.9 g/dL (ref 6.0–8.5)

## 2016-03-14 ENCOUNTER — Other Ambulatory Visit: Payer: Self-pay | Admitting: *Deleted

## 2016-03-14 DIAGNOSIS — R351 Nocturia: Secondary | ICD-10-CM

## 2016-03-14 DIAGNOSIS — E875 Hyperkalemia: Secondary | ICD-10-CM

## 2016-03-16 ENCOUNTER — Other Ambulatory Visit: Payer: Medicare Other

## 2016-03-16 DIAGNOSIS — R351 Nocturia: Secondary | ICD-10-CM | POA: Diagnosis not present

## 2016-03-16 LAB — URINALYSIS, COMPLETE
BILIRUBIN UA: NEGATIVE
GLUCOSE, UA: NEGATIVE
Ketones, UA: NEGATIVE
Leukocytes, UA: NEGATIVE
Nitrite, UA: NEGATIVE
PH UA: 6.5 (ref 5.0–7.5)
PROTEIN UA: NEGATIVE
RBC UA: NEGATIVE
Specific Gravity, UA: 1.02 (ref 1.005–1.030)
UUROB: 0.2 mg/dL (ref 0.2–1.0)

## 2016-03-16 LAB — MICROSCOPIC EXAMINATION

## 2016-03-18 ENCOUNTER — Telehealth: Payer: Self-pay | Admitting: Nurse Practitioner

## 2016-03-18 NOTE — Telephone Encounter (Signed)
Spoke to pt and advised we are waiting for the final report to come back with the sensitivities so we will know which antibiotic to treat the UTI with. Pt voiced understanding.

## 2016-03-20 LAB — URINE CULTURE

## 2016-03-23 ENCOUNTER — Other Ambulatory Visit: Payer: Self-pay | Admitting: Family

## 2016-03-23 ENCOUNTER — Other Ambulatory Visit: Payer: Self-pay | Admitting: Nurse Practitioner

## 2016-03-23 MED ORDER — CIPROFLOXACIN HCL 500 MG PO TABS: 500.0000 mg | ORAL_TABLET | Freq: Two times a day (BID) | ORAL | 0 refills | Status: DC

## 2016-03-30 ENCOUNTER — Ambulatory Visit (INDEPENDENT_AMBULATORY_CARE_PROVIDER_SITE_OTHER): Payer: Medicare Other | Admitting: Cardiovascular Disease

## 2016-03-30 ENCOUNTER — Encounter: Payer: Self-pay | Admitting: Cardiovascular Disease

## 2016-03-30 VITALS — BP 100/64 | HR 64 | Ht 63.5 in | Wt 126.2 lb

## 2016-03-30 DIAGNOSIS — E785 Hyperlipidemia, unspecified: Secondary | ICD-10-CM

## 2016-03-30 DIAGNOSIS — I2510 Atherosclerotic heart disease of native coronary artery without angina pectoris: Secondary | ICD-10-CM | POA: Diagnosis not present

## 2016-03-30 DIAGNOSIS — I201 Angina pectoris with documented spasm: Secondary | ICD-10-CM | POA: Diagnosis not present

## 2016-03-30 DIAGNOSIS — Z79899 Other long term (current) drug therapy: Secondary | ICD-10-CM | POA: Diagnosis not present

## 2016-03-30 MED ORDER — ROSUVASTATIN CALCIUM 10 MG PO TABS: 10.0000 mg | ORAL_TABLET | ORAL | 3 refills | Status: DC

## 2016-03-30 NOTE — Progress Notes (Signed)
Cardiology Office Note    Date:  03/30/2016   ID:  Christine Sandoval, DOB 08/27/1940, MRN [id number]  PCP:  Chevis Pretty, FNP  Cardiologist:   Sanda Klein, MD   Chief Complaint  Patient presents with  . Follow-up    History of Present Illness:  Christine Sandoval is a 75 y.o. female with 2 previous episodes of non-ST segment elevation myocardial infarction in the absence of significant coronary artery disease, presumed to be secondary to vasospasm. The first episode occurred in the 1980s when she was smoking, the second occurred in 2010 when she had already quit smoking. Coronary angiography showed no evidence of any obstructive lesion. She has not had angina pectoris in the last 7 years. She pays a lot of attention to a healthy diet and healthy lifestyle. In the past she took statins, but discontinued them since she felt it caused severe restless leg syndrome which prevented sleep. She has been taking red yeast rice supplements for the last few years.  Recently performed lipid profile shows deterioration in both total cholesterol and LDL cholesterol, although she is still taking the same quantity of red [place] rice supplement. She believes that she may have changed the brand over the last couple of years. She has also been diagnosed with mild hypothyroidism (TSH mildly elevated, T4 normal, T3 uptake ratio borderline). Just 2 weeks ago she started a low-dose of levothyroxine. She is currently taking an antibiotic for urinary tract infection. Potassium level was severely elevated (probably artifactual due to hemolysis) and remained borderline high when rechecked (she has since reduced the amount of salt substitute that she uses).    Past Medical History:  Diagnosis Date  . CAD (coronary artery disease)   . COPD (chronic obstructive pulmonary disease) ([place])   . History of coronary vasospasm   . Hyperlipidemia   . Mitral valve prolapse    mild  . OAB (overactive bladder)   . Osteoporosis    . STEMI (ST elevation myocardial infarction) ([place]) 2011   x 2 from coronary vasospasm  . Venous insufficiency     Past Surgical History:  Procedure Laterality Date  . ABLATION SAPHENOUS VEIN W/ RFA  07/05/2010   left  . BREAST LUMPECTOMY     benign  . CARDIAC CATHETERIZATION  03/07/2009   Normal coronaries  . CATARACT EXTRACTION     bilateral eyes  . COLONOSCOPY     history of polyps  . DILATION AND CURETTAGE OF UTERUS      X 2  . MELANOMA EXCISION    . NM MYOCAR PERF WALL MOTION  05/23/2008   No significant ischemia  . US ECHOCARDIOGRAPHY  05/23/2008   MVP,mild mitral annular ca+,mild MR,AOV mildlly sclerotic    Current Medications: Outpatient Medications Prior to Visit  Medication Sig Dispense Refill  . Arginine 500 MG CAPS Take 2 capsules by mouth daily. L-Arginine    . Ascorbic Acid (VITAMIN C) 1000 MG tablet Take 2 tablet a day    . aspirin 81 MG tablet Take 162 mg by mouth daily.     . B Complex Vitamins (B-COMPLEX/B-12 PO) Take 1 capsule by mouth daily.      . Calcium Citrate-Vitamin D (CALCIUM CITRATE + D PO) Take 500 mg by mouth.    . Cholecalciferol (VITAMIN D) 1000 UNITS capsule Take 2,000 Units by mouth daily.     . Coenzyme Q10 (CO Q 10) 100 MG CAPS Take 1 capsule by mouth daily.    . fish oil-omega-3  fatty acids 1000 MG capsule Take 1 g by mouth daily.     Nyoka Cowden Tea, Camillia sinensis, ([employer] SUPER GREEN TEA EXTRACT) 250 MG CAPS Take 1 capsule by mouth 2 times daily at 12 noon and 4 pm. Takes 350 egcg bid    . L-Lysine 500 MG CAPS Take 1 capsule by mouth daily.    . Magnesium 400 MG TABS Take 1 tablet by mouth at bedtime.    . Multiple Vitamin (MULTIVITAMIN) capsule Take 1 capsule by mouth daily.      . nitroGLYCERIN (NITROSTAT) 0.4 MG SL tablet Place 1 tablet (0.4 mg total) under the tongue every 5 (five) minutes as needed for chest pain. 25 tablet 3  . Red Yeast Rice Extract (RED YEAST RICE PO) Take 1 capsule by mouth 2 (two) times daily.     Marland Kitchen levothyroxine  (SYNTHROID, LEVOTHROID) 50 MCG tablet Take 1 tablet (50 mcg total) by mouth daily. (Patient not taking: Reported on 03/30/2016) 90 tablet 3  . ciprofloxacin (CIPRO) 500 MG tablet Take 1 tablet (500 mg total) by mouth 2 (two) times daily. (Patient not taking: Reported on 03/30/2016) 20 tablet 0   Facility-Administered Medications Prior to Visit  Medication Dose Route Frequency Provider Last Rate Last Dose  . 0.9 %  sodium chloride infusion  500 mL Intravenous Continuous Irene Shipper, MD         Allergies:   Codeine and Hydrochlorothiazide   Social History   Social History  . Marital status: Married    Spouse name: N/A  . Number of children: N/A  . Years of education: N/A   Social History Main Topics  . Smoking status: Former Smoker    Types: Cigarettes    Quit date: 08/03/1988  . Smokeless tobacco: Never Used  . Alcohol use No  . Drug use: No  . Sexual activity: Not on file   Other Topics Concern  . Not on file   Social History Narrative  . No narrative on file     Family History:  The patient's family history includes Alcohol abuse in her mother; Colon cancer in her maternal aunt and maternal uncle; Heart disease (age of onset: 36) in her sister; Heart failure in her father.   ROS:   Please see the history of present illness.    ROS All other systems reviewed and are negative.   PHYSICAL EXAM:   VS:  BP 100/64 (BP Location: Right Arm, Patient Position: Sitting, Cuff Size: Normal)   Pulse 64   Ht 5' 3.5" (1.613 m)   Wt 126 lb 3.2 oz (57.2 kg)   SpO2 97%   BMI 22.00 kg/m    GEN: Well nourished, well developed, in no acute distress  HEENT: normal  Neck: no JVD, carotid bruits, or masses Cardiac: RRR; no murmurs, rubs, or gallops,no edema  Respiratory:  clear to auscultation bilaterally, normal work of breathing GI: soft, nontender, nondistended, + BS MS: no deformity or atrophy  Skin: warm and dry, no rash Neuro:  Alert and Oriented x 3, Strength and sensation are  intact Psych: euthymic mood, full affect  Wt Readings from Last 3 Encounters:  03/30/16 126 lb 3.2 oz (57.2 kg)  03/09/16 126 lb 6.4 oz (57.3 kg)  02/01/16 124 lb 9.6 oz (56.5 kg)      Studies/Labs Reviewed:   EKG:  EKG is ordered today.  The ekg ordered today demonstrates NSR, normal tracing  Recent Labs: 03/09/2016: Platelets 291; TSH 5.370 03/12/2016: ALT  21; BUN 20; Creatinine, Ser 0.89; Potassium 5.4; Sodium 141   Lipid Panel    Component Value Date/Time   CHOL 277 (H) 03/09/2016 1137   CHOL 171 02/21/2013 1031   TRIG 178 (H) 03/09/2016 1137   TRIG 197 (H) 11/28/2013 0803   TRIG 148 02/21/2013 1031   HDL 71 03/09/2016 1137   HDL 74 11/28/2013 0803   HDL 56 02/21/2013 1031   CHOLHDL 3.9 03/09/2016 1137   CHOLHDL 2.0 03/07/2009 1300   VLDL 19 03/07/2009 1300   LDLCALC 170 (H) 03/09/2016 1137   LDLCALC 157 (H) 11/28/2013 0803   LDLCALC 85 02/21/2013 1031     ASSESSMENT:    1. Coronary artery disease involving native coronary artery of native heart without angina pectoris   2. Dyslipidemia   3. Medication management      PLAN:  In order of problems listed above:  1. CAD: No significant atherosclerotic lesions were seen at her last cardiac catheterization 2010 and is presumed that her previous myocardial infarction has happened due to vasospasm. She quit smoking many years ago. Hyperlipidemia remains the major risk factor for future events. 2. HLP: She had an excellent pharmacological response to statin therapy, but discontinued the medications which she felt were the source of "restless legs". Her LDL cholesterol is now markedly elevated, meeting criteria for statin therapy even in the absence of a secondary prevention indication. It is possible that she is taking a less effective or a lower dose of red yeast rice. I have advised administration of a reliable standardized medication such as a statin. We'll start with rosuvastatin just once weekly to test for  tolerability. Target LDL under 100. Check labs in 3 months.    Medication Adjustments/Labs and Tests Ordered: Current medicines are reviewed at length with the patient today.  Concerns regarding medicines are outlined above.  Medication changes, Labs and Tests ordered today are listed in the Patient Instructions below. Patient Instructions  Medication Instructions: Dr Sallyanne Kuster has recommended making the following medication changes: 1. START Crestor 10 mg - take 1 tablet by mouth once weekly  Labwork: Your physician recommends that you return for lab work in 3 months - FASTING.  Testing/Procedures: NONE ORDERED  Follow-up: Dr Sallyanne Kuster recommends that you schedule a follow-up appointment in 12 months. You will receive a reminder letter in the mail two months in advance. If you don't receive a letter, please call our office to schedule the follow-up appointment.  If you need a refill on your cardiac medications before your next appointment, please call your pharmacy.    Signed, Sanda Klein, MD  03/30/2016 12:03 PM    [address], [place], [place]  [address] Phone: [phone]; Fax: [phone]

## 2016-03-30 NOTE — Patient Instructions (Signed)
Medication Instructions: Dr Sallyanne Kuster has recommended making the following medication changes: 1. START Crestor 10 mg - take 1 tablet by mouth once weekly  Labwork: Your physician recommends that you return for lab work in 3 months - FASTING.  Testing/Procedures: NONE ORDERED  Follow-up: Dr Sallyanne Kuster recommends that you schedule a follow-up appointment in 12 months. You will receive a reminder letter in the mail two months in advance. If you don't receive a letter, please call our office to schedule the follow-up appointment.  If you need a refill on your cardiac medications before your next appointment, please call your pharmacy.

## 2016-04-27 ENCOUNTER — Other Ambulatory Visit: Payer: Medicare Other

## 2016-04-27 DIAGNOSIS — E875 Hyperkalemia: Secondary | ICD-10-CM | POA: Diagnosis not present

## 2016-04-27 LAB — BMP8+EGFR
BUN/Creatinine Ratio: 15 (ref 12–28)
BUN: 13 mg/dL (ref 8–27)
CALCIUM: 9.6 mg/dL (ref 8.7–10.3)
CHLORIDE: 102 mmol/L (ref 96–106)
CO2: 25 mmol/L (ref 18–29)
Creatinine, Ser: 0.87 mg/dL (ref 0.57–1.00)
GFR, EST AFRICAN AMERICAN: 77 mL/min/{1.73_m2} (ref >59)
GFR, EST NON AFRICAN AMERICAN: 67 mL/min/{1.73_m2} (ref >59)
Glucose: 138 mg/dL — ABNORMAL HIGH (ref 65–99)
Potassium: 5.2 mmol/L (ref 3.5–5.2)
Sodium: 142 mmol/L (ref 134–144)

## 2016-05-04 ENCOUNTER — Ambulatory Visit (INDEPENDENT_AMBULATORY_CARE_PROVIDER_SITE_OTHER): Payer: Medicare Other | Admitting: Family Medicine

## 2016-05-04 ENCOUNTER — Encounter: Payer: Self-pay | Admitting: Family Medicine

## 2016-05-04 VITALS — BP 98/67 | HR 72 | Temp 96.6°F | Ht 63.0 in | Wt 126.1 lb

## 2016-05-04 DIAGNOSIS — E039 Hypothyroidism, unspecified: Secondary | ICD-10-CM

## 2016-05-04 DIAGNOSIS — E782 Mixed hyperlipidemia: Secondary | ICD-10-CM | POA: Diagnosis not present

## 2016-05-04 DIAGNOSIS — I201 Angina pectoris with documented spasm: Secondary | ICD-10-CM | POA: Diagnosis not present

## 2016-05-04 NOTE — Patient Instructions (Signed)

## 2016-05-04 NOTE — Progress Notes (Signed)
Subjective:  Patient ID: Christine Sandoval, female    DOB: 09-29-1940  Age: 75 y.o. MRN: [id number]  CC: Hypothyroidism (pt here for follow up and to discuss hypothyroidism)   HPI Christine Sandoval presents for consultation regarding recent diagnosis of underactive thyroid. She has been taking her levothyroxine as ordered. She has not missed any doses that she is aware of. She does say she feels warmer. No frank hot flashes but some feeling of being uncomfortably warm occurs on occasion since starting the medication. She's been doing some reading and has questions about the free T4.  Patient in for follow-up of elevated cholesterol. Doing well without complaints on Crestor. However, due to statin phobia she is only taking it once a week currently. She denies any side effects such as muscle aches or restless legs. She had a reaction to atorvastatin and was kicking a lot in her sleep and uncomfortable therefore that medicine was discontinued. She was unclear as to the risk from the Crestor.. Denies side effects of statin including myalgia and arthralgia and nausea. Also in today for liver function testing. Currently no chest pain, shortness of breath or other cardiovascular related symptoms noted.   History Christine Sandoval has a past medical history of CAD (coronary artery disease); COPD (chronic obstructive pulmonary disease) ([place]); History of coronary vasospasm; Hyperlipidemia; Mitral valve prolapse; OAB (overactive bladder); Osteoporosis; STEMI (ST elevation myocardial infarction) ([place]) (2011); and Venous insufficiency.   She has a past surgical history that includes Colonoscopy; Melanoma excision; Breast lumpectomy; Dilation and curettage of uterus; Cataract extraction; Cardiac catheterization (03/07/2009); Ablation saphenous vein w/ RFA (07/05/2010); US ECHOCARDIOGRAPHY (05/23/2008); and NM MYOCAR PERF WALL MOTION (05/23/2008).   Her family history includes Alcohol abuse in her mother; Colon cancer in her maternal  aunt and maternal uncle; Heart disease (age of onset: 66) in her sister; Heart failure in her father.She reports that she quit smoking about 27 years ago. Her smoking use included Cigarettes. She has never used smokeless tobacco. She reports that she does not drink alcohol or use drugs.    ROS Review of Systems  Constitutional: Negative for activity change, appetite change and fever.  HENT: Negative for congestion, rhinorrhea and sore throat.   Eyes: Negative for visual disturbance.  Respiratory: Negative for cough and shortness of breath.   Cardiovascular: Negative for chest pain and palpitations.  Gastrointestinal: Negative for abdominal pain, diarrhea and nausea.  Endocrine:       Asymptomatic with the exception of feeling a bit warm and distorted has some constipation which has not been an issue since starting the thyroid medication  Musculoskeletal: Negative for arthralgias and myalgias.    Objective:  BP 98/67   Pulse 72   Temp (!) 96.6 F (35.9 C) (Oral)   Ht 5\' 3"  (1.6 m)   Wt 126 lb 2 oz (57.2 kg)   BMI 22.34 kg/m   BP Readings from Last 3 Encounters:  05/04/16 98/67  03/30/16 100/64  03/09/16 122/80    Wt Readings from Last 3 Encounters:  05/04/16 126 lb 2 oz (57.2 kg)  03/30/16 126 lb 3.2 oz (57.2 kg)  03/09/16 126 lb 6.4 oz (57.3 kg)     Physical Exam  Constitutional: She is oriented to person, place, and time. She appears well-developed and well-nourished. No distress.  HENT:  Head: Normocephalic and atraumatic.  Eyes: EOM are normal.  Neck: Normal range of motion. No thyromegaly present.  Pulmonary/Chest: Effort normal.  Musculoskeletal: Normal range of motion.  Neurological: She is  alert and oriented to person, place, and time.  Psychiatric: She has a normal mood and affect. Her behavior is normal. Judgment and thought content normal.     Lab Results  Component Value Date   WBC 6.0 03/09/2016   HGB 13.3 03/08/2009   HCT 43.5 03/09/2016   PLT  291 03/09/2016   GLUCOSE 138 (H) 04/27/2016   CHOL 277 (H) 03/09/2016   TRIG 178 (H) 03/09/2016   HDL 71 03/09/2016   LDLCALC 170 (H) 03/09/2016   ALT 21 03/12/2016   AST 41 (H) 03/12/2016   NA 142 04/27/2016   K 5.2 04/27/2016   CL 102 04/27/2016   CREATININE 0.87 04/27/2016   BUN 13 04/27/2016   CO2 25 04/27/2016   TSH 5.370 (H) 03/09/2016   INR 0.9 03/07/2009    US Aorta  Result Date: 01/02/2014 CLINICAL DATA:  Medicare screening exam for abdominal aortic aneurysm. EXAM: ABDOMINAL AORTA SCREENING ULTRASOUND TECHNIQUE: Ultrasound examination of the abdominal aorta was performed as a screening evaluation for abdominal aortic aneurysm. COMPARISON:  None. FINDINGS: Abdominal Aorta No aneurysm identified. Maximum AP Diameter:  2.7 cm. Maximum TRV Diameter: 2.5 cm IMPRESSION: No abdominal aneurysm is identified. Electronically Signed   By: Inez Catalina M.D.   On: 01/02/2014 08:47    Assessment & Plan:   [name] was seen today for hypothyroidism.  Diagnoses and all orders for this visit:  Acquired hypothyroidism -     TSH + free T4; Future -     Thyroid Peroxidase Antibody; Future  Mixed hyperlipidemia    35 minutes was spent  with the patient. Over half of this was spent in consultation regarding her thyroid and her elevated cholesterol and her concerns about each condition.  I have discontinued Ms. Creasey's Red Yeast Rice Extract (RED YEAST RICE PO) and ciprofloxacin. I am also having her maintain her aspirin, multivitamin, Vitamin D, fish oil-omega-3 fatty acids, vitamin C, B Complex Vitamins (B-COMPLEX/B-12 PO), Co Q 10, Arginine, Green Tea (Camillia sinensis), Calcium Citrate-Vitamin D (CALCIUM CITRATE + D PO), nitroGLYCERIN, L-Lysine, Magnesium, levothyroxine, Zinc, gelatin, and rosuvastatin. We will continue to administer sodium chloride.  No orders of the defined types were placed in this encounter.  Blood work on the 11th as that's the two-month date of her starting the  medicine. Follow-up with me will be in 3 months  Follow-up: Return in about 3 months (around 08/04/2016).  Claretta Fraise, M.D.

## 2016-05-08 ENCOUNTER — Ambulatory Visit (AMBULATORY_SURGERY_CENTER): Payer: Self-pay | Admitting: *Deleted

## 2016-05-08 VITALS — Ht 63.5 in | Wt 126.0 lb

## 2016-05-08 DIAGNOSIS — Z86010 Personal history of colonic polyps: Secondary | ICD-10-CM

## 2016-05-08 MED ORDER — MOVIPREP 100 G PO SOLR: ORAL | 0 refills | Status: DC

## 2016-05-08 NOTE — Progress Notes (Signed)
Patient denies any allergies to eggs or soy. Patient denies any problems with anesthesia/sedation. Patient denies any oxygen use at home and does not take any diet/weight loss medications. Pt declined EMMI education. Patient requests Moviprep, she states this cleaned her out last time. Moviprep instructions given and patient is aware that this may cost her at the pharmacy and she was ok with that.

## 2016-05-13 ENCOUNTER — Other Ambulatory Visit (INDEPENDENT_AMBULATORY_CARE_PROVIDER_SITE_OTHER): Payer: Medicare Other

## 2016-05-13 DIAGNOSIS — E039 Hypothyroidism, unspecified: Secondary | ICD-10-CM

## 2016-05-14 LAB — TSH+FREE T4
Free T4: 1.64 ng/dL (ref 0.82–1.77)
TSH: 0.471 u[IU]/mL (ref 0.450–4.500)

## 2016-05-14 LAB — THYROID PEROXIDASE ANTIBODY: THYROID PEROXIDASE ANTIBODY: 11 [IU]/mL (ref 0–34)

## 2016-05-22 ENCOUNTER — Encounter: Payer: Medicare Other | Admitting: Internal Medicine

## 2016-05-26 ENCOUNTER — Encounter: Payer: Medicare Other | Admitting: *Deleted

## 2016-05-26 DIAGNOSIS — Z1231 Encounter for screening mammogram for malignant neoplasm of breast: Secondary | ICD-10-CM | POA: Diagnosis not present

## 2016-05-29 ENCOUNTER — Telehealth: Payer: Self-pay | Admitting: Family Medicine

## 2016-05-29 NOTE — Telephone Encounter (Signed)
Thyroid panel was normal range.

## 2016-07-20 DIAGNOSIS — D219 Benign neoplasm of connective and other soft tissue, unspecified: Secondary | ICD-10-CM | POA: Diagnosis not present

## 2016-07-20 DIAGNOSIS — Z85828 Personal history of other malignant neoplasm of skin: Secondary | ICD-10-CM | POA: Diagnosis not present

## 2016-07-20 DIAGNOSIS — L570 Actinic keratosis: Secondary | ICD-10-CM | POA: Diagnosis not present

## 2016-10-29 ENCOUNTER — Other Ambulatory Visit: Payer: Self-pay | Admitting: Cardiovascular Disease

## 2016-10-29 DIAGNOSIS — Z79899 Other long term (current) drug therapy: Secondary | ICD-10-CM | POA: Diagnosis not present

## 2016-10-29 DIAGNOSIS — E785 Hyperlipidemia, unspecified: Secondary | ICD-10-CM | POA: Diagnosis not present

## 2016-10-30 LAB — COMPREHENSIVE METABOLIC PANEL
ALT: 24 IU/L (ref 0–32)
AST: 29 IU/L (ref 0–40)
Albumin/Globulin Ratio: 1.9 (ref 1.2–2.2)
Albumin: 4.5 g/dL (ref 3.5–4.8)
Alkaline Phosphatase: 77 IU/L (ref 39–117)
BUN/Creatinine Ratio: 22 (ref 12–28)
BUN: 17 mg/dL (ref 8–27)
Bilirubin Total: 0.5 mg/dL (ref 0.0–1.2)
CALCIUM: 9.9 mg/dL (ref 8.7–10.3)
CHLORIDE: 102 mmol/L (ref 96–106)
CO2: 27 mmol/L (ref 18–29)
Creatinine, Ser: 0.79 mg/dL (ref 0.57–1.00)
GFR calc Af Amer: 86 mL/min/{1.73_m2} (ref >59)
GFR, EST NON AFRICAN AMERICAN: 74 mL/min/{1.73_m2} (ref >59)
GLUCOSE: 103 mg/dL — AB (ref 65–99)
Globulin, Total: 2.4 g/dL (ref 1.5–4.5)
Potassium: 5.9 mmol/L — ABNORMAL HIGH (ref 3.5–5.2)
Sodium: 144 mmol/L (ref 134–144)
TOTAL PROTEIN: 6.9 g/dL (ref 6.0–8.5)

## 2016-10-30 LAB — LIPID PANEL W/O CHOL/HDL RATIO
Cholesterol, Total: 290 mg/dL — ABNORMAL HIGH (ref 100–199)
HDL: 76 mg/dL (ref >39)
LDL Calculated: 173 mg/dL — ABNORMAL HIGH (ref 0–99)
Triglycerides: 204 mg/dL — ABNORMAL HIGH (ref 0–149)
VLDL CHOLESTEROL CAL: 41 mg/dL — AB (ref 5–40)

## 2016-10-30 LAB — AMBIG ABBREV CMP14 DEFAULT

## 2016-10-30 LAB — AMBIG ABBREV LP DEFAULT

## 2016-11-06 ENCOUNTER — Telehealth: Payer: Self-pay | Admitting: Cardiovascular Disease

## 2016-11-06 ENCOUNTER — Telehealth: Payer: Self-pay

## 2016-11-06 ENCOUNTER — Telehealth: Payer: Self-pay | Admitting: Pharmacist

## 2016-11-06 DIAGNOSIS — E875 Hyperkalemia: Secondary | ICD-10-CM

## 2016-11-06 NOTE — Telephone Encounter (Signed)
lmtcb

## 2016-11-06 NOTE — Telephone Encounter (Signed)
New Message  ° ° °Pt is returning your call about test results  °

## 2016-11-06 NOTE — Telephone Encounter (Signed)
Talked to patient in-depth about her supplements. She is not taking anything that may cause a direct rise on potassium or has potassium on formulation/.   Recommended to :  1. Avoid food high in potassium or any product with added potassium  2. Change magnesium oxide supplement from daily to every other day  3. Stop taking Zinc supplement ( not needed)  4. Stay hydrated during the day  **Apple cider vinegar may contribute to potassium excretion - no need to stop at this time**

## 2016-11-06 NOTE — Telephone Encounter (Signed)
-----   Message from [place] sent at 11/06/2016  4:32 PM EDT ----- Returned call to patient. Reviewed results and recommendations.  Patient inquired whether Levothyroxine would cause the elevated K+. Discuss with Kamora Vossler, PharmD - levothyroxine should not affected K+ value. Jaquille Kau will also review patient's supplements to make sure she is not taking additional K+.  Notified patient that I would mail her paper work to take with her to the lab for additional tests. Patient verbalized understanding and agreed with plan.

## 2016-11-06 NOTE — Telephone Encounter (Signed)
Notes recorded by Diana Eves, CMA on 11/06/2016 at 4:32 PM EDT Returned call to patient. Reviewed results and recommendations.  Patient inquired whether Levothyroxine would cause the elevated K+. Discuss with Raquel, PharmD - levothyroxine should not affected K+ value. Raquel will also review patient's supplements to make sure she is not taking additional K+.  Notified patient that I would mail her paper work to take with her to the lab for additional tests. Patient verbalized understanding and agreed with plan.

## 2016-11-06 NOTE — Telephone Encounter (Signed)
Please see other telephone encounter from 11/06/16.

## 2016-11-06 NOTE — Telephone Encounter (Signed)
-----   Message from Sanda Klein, MD sent at 11/02/2016 11:31 AM EDT ----- Mildly elevated K (a recurrent problem). Other routine labs are OK. Cholesterol numbers the same as 7 months ago. Is she taking the once weekly Crestor or did she have to stop it? Is she taking red yeast rice? Please check the following to help [place] clarify the cause of the hyperkalemia. These need to be submitted simultaneously: - BMET - serum osmolality - urine potassium  - urine osmolality Have her check her supplements again and make sure she is not taking K supplements. Thanks

## 2016-11-10 DIAGNOSIS — H524 Presbyopia: Secondary | ICD-10-CM | POA: Diagnosis not present

## 2016-11-10 DIAGNOSIS — H26493 Other secondary cataract, bilateral: Secondary | ICD-10-CM | POA: Diagnosis not present

## 2016-11-10 DIAGNOSIS — Z961 Presence of intraocular lens: Secondary | ICD-10-CM | POA: Diagnosis not present

## 2016-11-24 DIAGNOSIS — E875 Hyperkalemia: Secondary | ICD-10-CM | POA: Diagnosis not present

## 2016-11-25 LAB — BASIC METABOLIC PANEL
BUN: 23 mg/dL (ref 7–25)
CALCIUM: 9.7 mg/dL (ref 8.6–10.4)
CO2: 26 mmol/L (ref 20–31)
CREATININE: 1.29 mg/dL — AB (ref 0.60–0.93)
Chloride: 103 mmol/L (ref 98–110)
Glucose, Bld: 101 mg/dL — ABNORMAL HIGH (ref 65–99)
Potassium: 4.9 mmol/L (ref 3.5–5.3)
Sodium: 139 mmol/L (ref 135–146)

## 2016-11-25 LAB — OSMOLALITY: Osmolality: 296 mOsm/kg (ref 278–305)

## 2016-11-25 LAB — POTASSIUM, URINE, RANDOM: POTASSIUM UR: 73 mmol/L (ref 12–129)

## 2016-11-25 LAB — OSMOLALITY, URINE: OSMOLALITY UR: 582 mosm/kg (ref 50–1200)

## 2016-12-08 ENCOUNTER — Encounter: Payer: Self-pay | Admitting: Family Medicine

## 2016-12-08 ENCOUNTER — Ambulatory Visit (INDEPENDENT_AMBULATORY_CARE_PROVIDER_SITE_OTHER): Payer: Medicare Other | Admitting: Family Medicine

## 2016-12-08 VITALS — BP 127/77 | HR 72 | Temp 97.5°F | Ht 63.5 in | Wt 126.0 lb

## 2016-12-08 DIAGNOSIS — R399 Unspecified symptoms and signs involving the genitourinary system: Secondary | ICD-10-CM | POA: Diagnosis not present

## 2016-12-08 DIAGNOSIS — N3090 Cystitis, unspecified without hematuria: Secondary | ICD-10-CM

## 2016-12-08 LAB — URINALYSIS, COMPLETE
Bilirubin, UA: NEGATIVE
Glucose, UA: NEGATIVE
Ketones, UA: NEGATIVE
Leukocytes, UA: NEGATIVE
NITRITE UA: NEGATIVE
Protein, UA: NEGATIVE
RBC, UA: NEGATIVE
Specific Gravity, UA: 1.02 (ref 1.005–1.030)
UUROB: 0.2 mg/dL (ref 0.2–1.0)
pH, UA: 6 (ref 5.0–7.5)

## 2016-12-08 LAB — MICROSCOPIC EXAMINATION
BACTERIA UA: NONE SEEN
RBC, UA: NONE SEEN /hpf (ref 0–?)
RENAL EPITHEL UA: NONE SEEN /HPF
WBC, UA: NONE SEEN /hpf (ref 0–?)

## 2016-12-08 MED ORDER — SULFAMETHOXAZOLE-TRIMETHOPRIM 800-160 MG PO TABS
1.0000 | ORAL_TABLET | Freq: Two times a day (BID) | ORAL | 0 refills | Status: DC
Start: 2016-12-08 — End: 2016-12-14

## 2016-12-08 NOTE — Progress Notes (Signed)
   HPI  Patient presents today for burning with urination and frequency for several days. Denies fever . No flank pain. No nausea, vomiting.   PMH: Smoking status noted ROS: Per HPI  Objective: BP 127/77   Pulse 72   Temp 97.5 F (36.4 C) (Oral)   Ht 5' 3.5" (1.613 m)   Wt 126 lb (57.2 kg)   BMI 21.97 kg/m  Gen: NAD, alert, cooperative with exam HEENT: NCAT, EOMI, PERRL CV: RRR, good S1/S2, no murmur Resp: CTABL, no wheezes, non-labored Abd: SNTND, BS present, no guarding or organomegaly Ext: No edema, warm Neuro: Alert and oriented, No gross deficits  Assessment and plan:  UTI symptoms - Plan: Urinalysis, Complete  Cystitis    Orders Placed This Encounter  Procedures  . Urinalysis, Complete    Meds ordered this encounter  Medications  . sulfamethoxazole-trimethoprim (BACTRIM DS) 800-160 MG tablet    Sig: Take 1 tablet by mouth 2 (two) times daily.    Dispense:  14 tablet    Refill:  0   Follow up if sx persist

## 2016-12-14 ENCOUNTER — Telehealth: Payer: Self-pay | Admitting: Family Medicine

## 2016-12-14 ENCOUNTER — Other Ambulatory Visit: Payer: Self-pay | Admitting: Family Medicine

## 2016-12-14 MED ORDER — CIPROFLOXACIN HCL 250 MG PO TABS: 250.0000 mg | ORAL_TABLET | Freq: Two times a day (BID) | ORAL | 0 refills | Status: DC

## 2016-12-14 NOTE — Telephone Encounter (Signed)
I sent in the requested prescription 

## 2016-12-14 NOTE — Telephone Encounter (Signed)
Pt notified of RX 

## 2016-12-14 NOTE — Telephone Encounter (Signed)
Pt started taking Bactrim last Tuesday Pt now has rash She is still having dysuria Please change antibiotic to something else

## 2016-12-17 ENCOUNTER — Telehealth: Payer: Self-pay | Admitting: Family Medicine

## 2016-12-17 ENCOUNTER — Other Ambulatory Visit: Payer: Medicare Other

## 2016-12-17 ENCOUNTER — Other Ambulatory Visit: Payer: Self-pay | Admitting: *Deleted

## 2016-12-17 DIAGNOSIS — R300 Dysuria: Secondary | ICD-10-CM

## 2016-12-17 DIAGNOSIS — R350 Frequency of micturition: Secondary | ICD-10-CM | POA: Diagnosis not present

## 2016-12-17 NOTE — Telephone Encounter (Signed)
Pt wants urine culture Pt having continued symptoms after antibiotic Order entered in Epic

## 2016-12-19 LAB — URINE CULTURE

## 2016-12-21 ENCOUNTER — Other Ambulatory Visit: Payer: Self-pay | Admitting: Family Medicine

## 2016-12-21 ENCOUNTER — Telehealth: Payer: Self-pay | Admitting: Family Medicine

## 2016-12-21 DIAGNOSIS — D235 Other benign neoplasm of skin of trunk: Secondary | ICD-10-CM | POA: Diagnosis not present

## 2016-12-21 DIAGNOSIS — D485 Neoplasm of uncertain behavior of skin: Secondary | ICD-10-CM | POA: Diagnosis not present

## 2016-12-21 MED ORDER — CIPROFLOXACIN HCL 250 MG PO TABS: 250.0000 mg | ORAL_TABLET | Freq: Two times a day (BID) | ORAL | 0 refills | Status: DC

## 2016-12-21 NOTE — Telephone Encounter (Signed)
Will you please review urine culture, advise, and route to Pool A

## 2016-12-21 NOTE — Telephone Encounter (Signed)
Please contact the patient to let her know if she still has some infection in her urine. I renewed her antibiotic.

## 2016-12-22 NOTE — Telephone Encounter (Signed)
Patient aware of results.

## 2016-12-22 NOTE — Telephone Encounter (Signed)
lmtcb jkp 5/22

## 2017-01-04 DIAGNOSIS — Z4802 Encounter for removal of sutures: Secondary | ICD-10-CM | POA: Diagnosis not present

## 2017-01-12 ENCOUNTER — Ambulatory Visit (INDEPENDENT_AMBULATORY_CARE_PROVIDER_SITE_OTHER): Payer: Medicare Other | Admitting: Family Medicine

## 2017-01-12 ENCOUNTER — Encounter: Payer: Self-pay | Admitting: Family Medicine

## 2017-01-12 VITALS — BP 108/69 | HR 70 | Temp 96.9°F | Ht 63.5 in | Wt 126.0 lb

## 2017-01-12 DIAGNOSIS — E039 Hypothyroidism, unspecified: Secondary | ICD-10-CM

## 2017-01-12 NOTE — Progress Notes (Signed)
Subjective:  Patient ID: Christine Sandoval, female    DOB: 05/07/1941  Age: 76 y.o. MRN: [id number]  CC: Medication Management (pt here today to discuss the levothyroxine and hypothyroidism as she has been doing research and feels she has confused herself even more.)   HPI Christine Sandoval presents for thyroid sx including hair loss, feeling cold  A lot. Nails brittle. HAving night sweats. Awake 2-4 hours during the night. Feeling fatigue and retaining fluid. No adjustment of med since last OV, but sx actually worse. Onset 6 mos ago.   History Christine Sandoval has a past medical history of CAD (coronary artery disease); COPD (chronic obstructive pulmonary disease) ([place]); History of coronary vasospasm; Hyperlipidemia; Mitral valve prolapse; OAB (overactive bladder); Osteoporosis; STEMI (ST elevation myocardial infarction) ([place]) (2011); and Venous insufficiency.   She has a past surgical history that includes Colonoscopy; Melanoma excision; Breast lumpectomy; Dilation and curettage of uterus; Cataract extraction; Cardiac catheterization (03/07/2009); Ablation saphenous vein w/ RFA (07/05/2010); US ECHOCARDIOGRAPHY (05/23/2008); and NM MYOCAR PERF WALL MOTION (05/23/2008).   Her family history includes Alcohol abuse in her mother; Colon cancer in her maternal uncle; Colon cancer (age of onset: 40) in her maternal aunt; Heart disease (age of onset: 41) in her sister; Heart failure in her father.She reports that she quit smoking about 28 years ago. Her smoking use included Cigarettes. She has never used smokeless tobacco. She reports that she does not drink alcohol or use drugs.    ROS Review of Systems  Constitutional: Positive for diaphoresis and fatigue. Negative for activity change, appetite change and fever.  HENT: Negative for congestion, rhinorrhea and sore throat.   Eyes: Negative for visual disturbance.  Respiratory: Negative for cough and shortness of breath.   Cardiovascular: Negative for chest pain and  palpitations.  Gastrointestinal: Negative for abdominal pain, diarrhea and nausea.  Endocrine: Positive for cold intolerance.  Genitourinary: Negative for dysuria.  Musculoskeletal: Negative for arthralgias and myalgias.    Objective:  BP 108/69   Pulse 70   Temp (!) 96.9 F (36.1 C) (Oral)   Ht 5' 3.5" (1.613 m)   Wt 126 lb (57.2 kg)   BMI 21.97 kg/m   BP Readings from Last 3 Encounters:  01/12/17 108/69  12/08/16 127/77  05/04/16 98/67    Wt Readings from Last 3 Encounters:  01/12/17 126 lb (57.2 kg)  12/08/16 126 lb (57.2 kg)  05/08/16 126 lb (57.2 kg)     Physical Exam  Constitutional: She is oriented to person, place, and time. She appears well-developed and well-nourished. No distress.  HENT:  Head: Normocephalic and atraumatic.  Right Ear: External ear normal.  Left Ear: External ear normal.  Nose: Nose normal.  Mouth/Throat: Oropharynx is clear and moist.  Eyes: Conjunctivae and EOM are normal. Pupils are equal, round, and reactive to light.  Neck: Normal range of motion. Neck supple. No thyromegaly present.  Cardiovascular: Normal rate, regular rhythm and normal heart sounds.   No murmur heard. Pulmonary/Chest: Effort normal and breath sounds normal. No respiratory distress. She has no wheezes. She has no rales.  Abdominal: Soft. Bowel sounds are normal. She exhibits no distension. There is no tenderness.  Lymphadenopathy:    She has no cervical adenopathy.  Neurological: She is alert and oriented to person, place, and time. She has normal reflexes.  Skin: Skin is warm and dry.  Psychiatric: She has a normal mood and affect. Her behavior is normal. Judgment and thought content normal.      Assessment &  Plan:   Miosha was seen today for medication management.  Diagnoses and all orders for this visit:  Hypothyroidism, unspecified type -     TSH + free T4 -     T3, Free -     CBC with Differential/Platelet -     CMP14+EGFR       I have  discontinued Christine Sandoval's ciprofloxacin. I am also having her maintain her aspirin, multivitamin, Vitamin D, fish oil-omega-3 fatty acids, vitamin C, B Complex Vitamins (B-COMPLEX/B-12 PO), Co Q 10, Arginine, Green Tea (Camillia sinensis), Calcium Citrate-Vitamin D (CALCIUM CITRATE + D PO), nitroGLYCERIN, L-Lysine, Magnesium, levothyroxine, and rosuvastatin. We will continue to administer sodium chloride.  Allergies as of 01/12/2017      Reactions   Bactrim [sulfamethoxazole-trimethoprim] Rash   Codeine Other (See Comments)   PT FEELS CRAZY ON IT   Hydrochlorothiazide Other (See Comments), Cough      Medication List       Accurate as of 01/12/17 10:38 PM. Always use your most recent med list.          Arginine 500 MG Caps Take 2 capsules by mouth daily. L-Arginine   aspirin 81 MG tablet Take 162 mg by mouth daily.   B-COMPLEX/B-12 PO Take 1 capsule by mouth daily.   CALCIUM CITRATE + D PO Take 500 mg by mouth.   Co Q 10 100 MG Caps Take 1 capsule by mouth daily.   [employer] SUPER GREEN TEA EXTRACT 250 MG Caps Generic drug:  Green Tea (Camillia sinensis) Take 1 capsule by mouth 2 times daily at 12 noon and 4 pm. Takes 350 egcg bid   fish oil-omega-3 fatty acids 1000 MG capsule Take 1 g by mouth daily.   L-Lysine 500 MG Caps Take 1 capsule by mouth daily.   levothyroxine 50 MCG tablet Commonly known as:  SYNTHROID, LEVOTHROID Take 1 tablet (50 mcg total) by mouth daily.   Magnesium 400 MG Tabs Take 1 tablet by mouth at bedtime.   multivitamin capsule Take 1 capsule by mouth daily.   nitroGLYCERIN 0.4 MG SL tablet Commonly known as:  NITROSTAT Place 1 tablet (0.4 mg total) under the tongue every 5 (five) minutes as needed for chest pain.   rosuvastatin 10 MG tablet Commonly known as:  CRESTOR Take 1 tablet (10 mg total) by mouth once a week.   vitamin C 1000 MG tablet Take 2 tablet a day   Vitamin D 1000 units capsule Take 2,000 Units by mouth daily.         Follow-up: Return in about 2 months (around 03/14/2017).  Claretta Fraise, M.D.

## 2017-01-13 ENCOUNTER — Other Ambulatory Visit: Payer: Medicare Other

## 2017-01-13 DIAGNOSIS — E039 Hypothyroidism, unspecified: Secondary | ICD-10-CM | POA: Diagnosis not present

## 2017-01-14 LAB — CMP14+EGFR
A/G RATIO: 1.8 (ref 1.2–2.2)
ALBUMIN: 4.2 g/dL (ref 3.5–4.8)
ALK PHOS: 68 IU/L (ref 39–117)
ALT: 22 IU/L (ref 0–32)
AST: 29 IU/L (ref 0–40)
BILIRUBIN TOTAL: 0.4 mg/dL (ref 0.0–1.2)
BUN / CREAT RATIO: 14 (ref 12–28)
BUN: 12 mg/dL (ref 8–27)
CO2: 24 mmol/L (ref 20–29)
CREATININE: 0.87 mg/dL (ref 0.57–1.00)
Calcium: 9.5 mg/dL (ref 8.7–10.3)
Chloride: 102 mmol/L (ref 96–106)
GFR calc Af Amer: 76 mL/min/{1.73_m2} (ref >59)
GFR calc non Af Amer: 66 mL/min/{1.73_m2} (ref >59)
GLOBULIN, TOTAL: 2.3 g/dL (ref 1.5–4.5)
Glucose: 92 mg/dL (ref 65–99)
POTASSIUM: 4.9 mmol/L (ref 3.5–5.2)
SODIUM: 140 mmol/L (ref 134–144)
Total Protein: 6.5 g/dL (ref 6.0–8.5)

## 2017-01-14 LAB — CBC WITH DIFFERENTIAL/PLATELET
BASOS ABS: 0 10*3/uL (ref 0.0–0.2)
Basos: 1 %
EOS (ABSOLUTE): 0.1 10*3/uL (ref 0.0–0.4)
Eos: 1 %
HEMATOCRIT: 43.4 % (ref 34.0–46.6)
HEMOGLOBIN: 14.7 g/dL (ref 11.1–15.9)
Immature Grans (Abs): 0 10*3/uL (ref 0.0–0.1)
Immature Granulocytes: 0 %
LYMPHS ABS: 1.7 10*3/uL (ref 0.7–3.1)
Lymphs: 30 %
MCH: 30.6 pg (ref 26.6–33.0)
MCHC: 33.9 g/dL (ref 31.5–35.7)
MCV: 90 fL (ref 79–97)
MONOS ABS: 0.5 10*3/uL (ref 0.1–0.9)
Monocytes: 9 %
Neutrophils Absolute: 3.3 10*3/uL (ref 1.4–7.0)
Neutrophils: 59 %
Platelets: 282 10*3/uL (ref 150–379)
RBC: 4.8 x10E6/uL (ref 3.77–5.28)
RDW: 14 % (ref 12.3–15.4)
WBC: 5.6 10*3/uL (ref 3.4–10.8)

## 2017-01-14 LAB — TSH+FREE T4
FREE T4: 1.66 ng/dL (ref 0.82–1.77)
TSH: 2.09 u[IU]/mL (ref 0.450–4.500)

## 2017-01-14 LAB — T3, FREE: T3 FREE: 2.8 pg/mL (ref 2.0–4.4)

## 2017-01-15 NOTE — Progress Notes (Signed)
Patient aware.

## 2017-01-25 ENCOUNTER — Encounter: Payer: Self-pay | Admitting: Family Medicine

## 2017-01-25 ENCOUNTER — Ambulatory Visit (INDEPENDENT_AMBULATORY_CARE_PROVIDER_SITE_OTHER): Payer: Medicare Other | Admitting: Family Medicine

## 2017-01-25 ENCOUNTER — Ambulatory Visit (INDEPENDENT_AMBULATORY_CARE_PROVIDER_SITE_OTHER): Payer: Medicare Other

## 2017-01-25 VITALS — BP 108/69 | HR 69 | Temp 96.9°F | Ht 63.5 in | Wt 126.0 lb

## 2017-01-25 DIAGNOSIS — R0789 Other chest pain: Secondary | ICD-10-CM

## 2017-01-25 MED ORDER — DICLOFENAC SODIUM 75 MG PO TBEC: 75.0000 mg | DELAYED_RELEASE_TABLET | Freq: Two times a day (BID) | ORAL | 2 refills | Status: DC

## 2017-01-25 NOTE — Progress Notes (Signed)
Subjective:  Patient ID: Christine Sandoval, female    DOB: [birth year]  Age: 76 y.o. MRN: [id number]  CC: Pain (pt here today c/o pain under the left breast/armpit especially when extending the left arm with any object in the left hand.)   HPI Christine Sandoval presents for Onset 3-4 days ago of pain in the left axilla. It radiates into the left upper chest at the superior portion of the breast. It feels sore. Pain is moderate. It is exacerbated by a cough, sneeze, laugh or trying to blow her nose. She has not had any significant upper respiratory symptoms or cough prior to this.   History Christine Sandoval has a past medical history of CAD (coronary artery disease); COPD (chronic obstructive pulmonary disease) ([place]); History of coronary vasospasm; Hyperlipidemia; Mitral valve prolapse; OAB (overactive bladder); Osteoporosis; STEMI (ST elevation myocardial infarction) ([place]) (2011); and Venous insufficiency.   She has a past surgical history that includes Colonoscopy; Melanoma excision; Breast lumpectomy; Dilation and curettage of uterus; Cataract extraction; Cardiac catheterization (03/07/2009); Ablation saphenous vein w/ RFA (07/05/2010); US ECHOCARDIOGRAPHY (05/23/2008); and NM MYOCAR PERF WALL MOTION (05/23/2008).   Her family history includes Alcohol abuse in her mother; Colon cancer in her maternal uncle; Colon cancer (age of onset: 57) in her maternal aunt; Heart disease (age of onset: 48) in her sister; Heart failure in her father.She reports that she quit smoking about 28 years ago. Her smoking use included Cigarettes. She has never used smokeless tobacco. She reports that she does not drink alcohol or use drugs.    ROS Review of Systems  Constitutional: Negative for activity change, appetite change and fever.  HENT: Negative for congestion, rhinorrhea and sore throat.   Eyes: Negative for visual disturbance.  Respiratory: Negative for cough and shortness of breath.   Cardiovascular: Negative for chest pain  and palpitations.  Gastrointestinal: Negative for abdominal pain, diarrhea and nausea.  Genitourinary: Negative for dysuria.  Musculoskeletal: Positive for arthralgias and myalgias.  Hematological: Does not bruise/bleed easily.  Psychiatric/Behavioral: The patient is not nervous/anxious.     Objective:  BP 108/69   Pulse 69   Temp (!) 96.9 F (36.1 C) (Oral)   Ht 5' 3.5" (1.613 m)   Wt 126 lb (57.2 kg)   BMI 21.97 kg/m   BP Readings from Last 3 Encounters:  01/25/17 108/69  01/12/17 108/69  12/08/16 127/77    Wt Readings from Last 3 Encounters:  01/25/17 126 lb (57.2 kg)  01/12/17 126 lb (57.2 kg)  12/08/16 126 lb (57.2 kg)     Physical Exam  Constitutional: She is oriented to person, place, and time. She appears well-developed and well-nourished. No distress.  HENT:  Head: Normocephalic and atraumatic.  Eyes: Conjunctivae are normal. Pupils are equal, round, and reactive to light.  Neck: Normal range of motion. Neck supple. No thyromegaly present.  Cardiovascular: Normal rate, regular rhythm and normal heart sounds.   No murmur heard. Pulmonary/Chest: Effort normal and breath sounds normal. No respiratory distress. She has no wheezes. She has no rales.  No rub at site of pain  Abdominal: Soft. Bowel sounds are normal. She exhibits no distension. There is no tenderness.  Musculoskeletal: Normal range of motion.  Lymphadenopathy:    She has no cervical adenopathy.  Neurological: She is alert and oriented to person, place, and time.  Skin: Skin is warm and dry.  Psychiatric: She has a normal mood and affect. Her behavior is normal. Judgment and thought content normal.  Assessment & Plan:   Christine Sandoval was seen today for pain.  Diagnoses and all orders for this visit:  Left-sided chest wall pain -     DG Ribs Unilateral W/Chest Left; Future  Other orders -     diclofenac (VOLTAREN) 75 MG EC tablet; Take 1 tablet (75 mg total) by mouth 2 (two) times daily. For  muscle and  Joint pain       I have discontinued Ms. Boyland's Magnesium. I am also having her start on diclofenac. Additionally, I am having her maintain her aspirin, multivitamin, Vitamin D, fish oil-omega-3 fatty acids, vitamin C, B Complex Vitamins (B-COMPLEX/B-12 PO), Co Q 10, Arginine, Green Tea (Camillia sinensis), Calcium Citrate-Vitamin D (CALCIUM CITRATE + D PO), nitroGLYCERIN, L-Lysine, levothyroxine, and rosuvastatin. We will continue to administer sodium chloride.  Allergies as of 01/25/2017      Reactions   Bactrim [sulfamethoxazole-trimethoprim] Rash   Codeine Other (See Comments)   PT FEELS CRAZY ON IT   Hydrochlorothiazide Other (See Comments), Cough      Medication List       Accurate as of 01/25/17  4:53 PM. Always use your most recent med list.          Arginine 500 MG Caps Take 2 capsules by mouth daily. L-Arginine   aspirin 81 MG tablet Take 162 mg by mouth daily.   B-COMPLEX/B-12 PO Take 1 capsule by mouth daily.   CALCIUM CITRATE + D PO Take 500 mg by mouth.   Co Q 10 100 MG Caps Take 1 capsule by mouth daily.   [employer] SUPER GREEN TEA EXTRACT 250 MG Caps Generic drug:  Green Tea (Camillia sinensis) Take 1 capsule by mouth 2 times daily at 12 noon and 4 pm. Takes 350 egcg bid   diclofenac 75 MG EC tablet Commonly known as:  VOLTAREN Take 1 tablet (75 mg total) by mouth 2 (two) times daily. For muscle and  Joint pain   fish oil-omega-3 fatty acids 1000 MG capsule Take 1 g by mouth daily.   L-Lysine 500 MG Caps Take 1 capsule by mouth daily.   levothyroxine 50 MCG tablet Commonly known as:  SYNTHROID, LEVOTHROID Take 1 tablet (50 mcg total) by mouth daily.   multivitamin capsule Take 1 capsule by mouth daily.   nitroGLYCERIN 0.4 MG SL tablet Commonly known as:  NITROSTAT Place 1 tablet (0.4 mg total) under the tongue every 5 (five) minutes as needed for chest pain.   rosuvastatin 10 MG tablet Commonly known as:  CRESTOR Take 1 tablet  (10 mg total) by mouth once a week.   vitamin C 1000 MG tablet Take 2 tablet a day   Vitamin D 1000 units capsule Take 2,000 Units by mouth daily.        Follow-up: Return if symptoms worsen or fail to improve.  Claretta Fraise, M.D.

## 2017-02-01 DIAGNOSIS — H353132 Nonexudative age-related macular degeneration, bilateral, intermediate dry stage: Secondary | ICD-10-CM | POA: Diagnosis not present

## 2017-02-01 DIAGNOSIS — H26493 Other secondary cataract, bilateral: Secondary | ICD-10-CM | POA: Diagnosis not present

## 2017-03-03 ENCOUNTER — Other Ambulatory Visit: Payer: Self-pay | Admitting: Family

## 2017-05-05 ENCOUNTER — Encounter: Payer: Self-pay | Admitting: *Deleted

## 2017-05-05 ENCOUNTER — Ambulatory Visit (INDEPENDENT_AMBULATORY_CARE_PROVIDER_SITE_OTHER): Payer: Medicare Other | Admitting: *Deleted

## 2017-05-05 VITALS — BP 128/79 | HR 65 | Ht 62.0 in | Wt 124.0 lb

## 2017-05-05 DIAGNOSIS — Z0000 Encounter for general adult medical examination without abnormal findings: Secondary | ICD-10-CM | POA: Diagnosis not present

## 2017-05-05 NOTE — Patient Instructions (Addendum)
  Christine Sandoval , Thank you for taking time to come for your Medicare Wellness Visit. I appreciate your ongoing commitment to your health goals. Please review the following plan we discussed and let me know if I can assist you in the future.   These are the goals we discussed: Goals    . Exercise 150 minutes per week (moderate activity)      This is a list of the screening recommended for you and due dates:  Health Maintenance  Topic Date Due  . Colon Cancer Screening  02/05/2016  . Flu Shot  03/03/2017  . Tetanus Vaccine  03/21/2018  . Mammogram  05/26/2018  . DEXA scan (bone density measurement)  Completed  . Pneumonia vaccines  Completed   Continue to stay active daily

## 2017-05-07 NOTE — Progress Notes (Signed)
Subjective:   Christine Sandoval is a 76 y.o. female who presents for an Initial Medicare Annual Wellness Visit. Christine Sandoval lives at home with her husband. He accompanies her to the visit today. They have 3 adult sons and 10 grandchildren. She is very active and exercises daily.   Review of Systems    Reports that her health is about the same as last year.   Cardiac Risk Factors include: advanced age (>72men, >2 women);dyslipidemia    Other systems negative today.  Objective:    Today's Vitals   05/05/17 1447  BP: 128/79  Pulse: 65  Weight: 124 lb (56.2 kg)  Height: 5\' 2"  (1.575 m)   Body mass index is 22.68 kg/m.   Current Medications (verified) Outpatient Encounter Prescriptions as of 05/05/2017  Medication Sig  . Arginine 500 MG CAPS Take 2 capsules by mouth daily. L-Arginine  . Ascorbic Acid (VITAMIN C) 1000 MG tablet Take 2 tablet a day  . aspirin 81 MG tablet Take 162 mg by mouth daily.   . B Complex Vitamins (B-COMPLEX/B-12 PO) Take 1 capsule by mouth daily.    . Calcium Citrate-Vitamin D (CALCIUM CITRATE + D PO) Take 500 mg by mouth.  . Cholecalciferol (VITAMIN D) 1000 UNITS capsule Take 2,000 Units by mouth daily.   . Coenzyme Q10 (CO Q 10) 100 MG CAPS Take 1 capsule by mouth daily.  . fish oil-omega-3 fatty acids 1000 MG capsule Take 1 g by mouth daily.   N[place]oka Cowden Tea, Camillia sinensis, ([employer]S SUPER GREEN TEA EXTRACT) 250 MG CAPS Take 1 capsule by mouth 2 times daily at 12 noon and 4 pm. Takes 350 egcg bid  . L-Lysine 500 MG CAPS Take 1 capsule by mouth daily.  Marland Kitchen levothyroxine (SYNTHROID, LEVOTHROID) 50 MCG tablet TAKE ONE TABLET BY MOUTH ONCE DAILY  . Multiple Vitamin (MULTIVITAMIN) capsule Take 1 capsule by mouth daily.    . nitroGLYCERIN (NITROSTAT) 0.4 MG SL tablet Place 1 tablet (0.4 mg total) under the tongue every 5 (five) minutes as needed for chest pain.  . [DISCONTINUED] diclofenac (VOLTAREN) 75 MG EC tablet Take 1 tablet (75 mg total) by mouth 2 (two) times  daily. For muscle and  Joint pain  . [DISCONTINUED] rosuvastatin (CRESTOR) 10 MG tablet Take 1 tablet (10 mg total) by mouth once a week.   Facility-Administered Encounter Medications as of 05/05/2017  Medication  . 0.9 %  sodium chloride infusion    Allergies (verified) Bactrim [sulfamethoxazole-trimethoprim]; Codeine; and Hydrochlorothiazide   History: Past Medical History:  Diagnosis Date  . CAD (coronary artery disease)   . COPD (chronic obstructive pulmonary disease) ([place])   . History of coronary vasospasm   . Hyperlipidemia   . Mitral valve prolapse    mild  . OAB (overactive bladder)   . Osteoporosis   . STEMI (ST elevation myocardial infarction) ([place]) 2011   at age 51 x 2 from coronary vasospasm; x 1 (2011)  . Venous insufficiency    Past Surgical History:  Procedure Laterality Date  . ABLATION SAPHENOUS VEIN W/ RFA  07/05/2010   left  . BREAST LUMPECTOMY     benign  . CARDIAC CATHETERIZATION  03/07/2009   Normal coronaries  . CATARACT EXTRACTION     bilateral eyes  . COLONOSCOPY     history of polyps  . DILATION AND CURETTAGE OF UTERUS      X 2  . MELANOMA EXCISION    . NM MYOCAR PERF WALL MOTION  05/23/2008  No significant ischemia  . US ECHOCARDIOGRAPHY  05/23/2008   MVP,mild mitral annular ca+,mild MR,AOV mildlly sclerotic   Family History  Problem Relation Age of Onset  . Colon cancer Maternal Aunt 75  . Alcohol abuse Mother   . Heart failure Father   . Colon cancer Maternal Uncle   . Heart disease Sister 89       CHF  . Healthy Son   . Healthy Son   . Diabetes Son   . Neuropathy Son    Social History   Occupational History  . Not on file.   Social History Main Topics  . Smoking status: Former Smoker    Types: Cigarettes    Quit date: 08/03/1988  . Smokeless tobacco: Never Used  . Alcohol use No  . Drug use: No  . Sexual activity: Not on file    Tobacco Counseling No tobacco use  Activities of Daily Living In your present state of  health, do you have any difficulty performing the following activities: 05/05/2017  Hearing? N  Vision? N  Difficulty concentrating or making decisions? N  Walking or climbing stairs? N  Dressing or bathing? N  Doing errands, shopping? N  Preparing Food and eating ? N  Using the Toilet? N  In the past six months, have you accidently leaked urine? N  Do you have problems with loss of bowel control? N  Managing your Medications? N  Comment pillbox  Managing your Finances? N  Housekeeping or managing your Housekeeping? N  Some recent data might be hidden    Immunizations and Health Maintenance Immunization History  Administered Date(s) Administered  . Influenza Whole 05/04/2007, 05/03/2009  . Influenza, High Dose Seasonal PF 05/16/2013, 05/20/2016  . Influenza,inj,Quad PF,6+ Mos 06/18/2014  . Influenza-Unspecified 06/26/2015  . Pneumococcal Conjugate-13 04/05/2015  . Pneumococcal Polysaccharide-23 07/01/2009  . Td 03/21/2008  . Zoster 08/03/2008   Health Maintenance Due  Topic Date Due  . COLONOSCOPY  02/05/2016  . INFLUENZA VACCINE  03/03/2017    Patient Care Team: Claretta Fraise, MD as PCP - General (Family Medicine)  No hospitalizations, ER visits, or surgeries this past year.      Assessment:   This is a routine wellness examination for Christine Sandoval.   Hearing/Vision screen No deficits noted. Eye exam is up to date.  Dietary issues and exercise activities discussed: Current Exercise Habits: Home exercise routine, Type of exercise: Other - see comments (Jogs in place), Time (Minutes): 45, Frequency (Times/Week): 7, Weekly Exercise (Minutes/Week): 315, Intensity: Moderate, Exercise limited by: None identified   Diet: Lunch is the larger meal with a light breakfast and light supper  Goals    . Exercise 150 minutes per week (moderate activity)      Depression Screen PHQ 2/9 Scores 05/05/2017 01/25/2017 01/12/2017 12/08/2016 05/04/2016 03/09/2016 02/01/2016  PHQ - 2 Score 0 0 0 0  0 0 0    Fall Risk Fall Risk  05/05/2017 01/25/2017 01/12/2017 12/08/2016 05/04/2016  Falls in the past year? Yes No No No No  Comment slipped on rocks - - - -  Number falls in past yr: 1 - - - -  Follow up Falls prevention discussed - - - -    Cognitive Function: MMSE - Mini Mental State Exam 05/05/2017  Orientation to time 5  Orientation to Place 5  Registration 3  Attention/ Calculation 5  Recall 3  Language- name 2 objects 2  Language- repeat 1  Language- follow 3 step command 3  Language-  read & follow direction 1  Write a sentence 1  Copy design 1  Total score 30        Screening Tests Health Maintenance  Topic Date Due  . COLONOSCOPY  02/05/2016  . INFLUENZA VACCINE  03/03/2017  . TETANUS/TDAP  03/21/2018  . MAMMOGRAM  05/26/2018  . DEXA SCAN  Completed  . PNA vac Low Risk Adult  Completed      Plan:  Flu shot recommneded. Keep f/u with PCP Continue to stay active and aim for at least 150 min of moderate activity a week  I have personally reviewed and noted the following in the patient's chart:   . Medical and social history . Use of alcohol, tobacco or illicit drugs  . Current medications and supplements . Functional ability and status . Nutritional status . Physical activity . Advanced directives . List of other physicians . Hospitalizations, surgeries, and ER visits in previous 12 months . Vitals . Screenings to include cognitive, depression, and falls . Referrals and appointments  In addition, I have reviewed and discussed with patient certain preventive protocols, quality metrics, and best practice recommendations. A written personalized care plan for preventive services as well as general preventive health recommendations were provided to patient.     Ilean China, RN   05/07/2017     .I have reviewed and agree with the above AWV documentation.  Claretta Fraise, M.D..

## 2017-05-31 ENCOUNTER — Ambulatory Visit (INDEPENDENT_AMBULATORY_CARE_PROVIDER_SITE_OTHER): Payer: Medicare Other | Admitting: Cardiovascular Disease

## 2017-05-31 ENCOUNTER — Encounter: Payer: Self-pay | Admitting: Cardiovascular Disease

## 2017-05-31 VITALS — BP 124/74 | HR 70 | Ht 63.0 in | Wt 124.0 lb

## 2017-05-31 DIAGNOSIS — I201 Angina pectoris with documented spasm: Secondary | ICD-10-CM

## 2017-05-31 DIAGNOSIS — E7800 Pure hypercholesterolemia, unspecified: Secondary | ICD-10-CM

## 2017-05-31 DIAGNOSIS — I2510 Atherosclerotic heart disease of native coronary artery without angina pectoris: Secondary | ICD-10-CM

## 2017-05-31 MED ORDER — NITROGLYCERIN 0.4 MG SL SUBL: 0.4000 mg | SUBLINGUAL_TABLET | SUBLINGUAL | 3 refills | Status: DC | PRN

## 2017-05-31 NOTE — Progress Notes (Signed)
Cardiology Office Note    Date:  05/31/2017   ID:  Christine Sandoval, DOB 1941/06/02, MRN [id number]  PCP:  Claretta Fraise, MD  Cardiologist:   Sanda Klein, MD   Chief Complaint  Patient presents with  . Follow-up    History of Present Illness:  Christine Sandoval is a 76 y.o. female with 2 previous episodes of non-ST segment elevation myocardial infarction in the absence of significant coronary artery disease, presumed to be secondary to vasospasm. The first episode occurred in the 1980s when she was smoking, the second occurred in 2010 when she had already quit smoking. Coronary angiography showed no evidence of any obstructive lesion. She has not had angina pectoris in the last 8 years. She pays a lot of attention to a healthy diet and healthy lifestyle.   In the past she took statins, but discontinued them since she felt it caused severe restless leg syndrome which prevented sleep.  Took red yeast rice supplements, but her lipid profile a year ago was still out of desirable range with an LDL cholesterol of 170.  We tried to start rosuvastatin once weekly, but she developed stinging neuropathy in her legs and discontinuing it.  The symptoms then resolved.  Transient problems with hypokalemia have resolved.  Her most recent thyroid tests were all normal.  Feels great.  She is physically active.  She jogs in place for 45 minutes a day every day except for Sundays when she only exercises for 30 minutes.  The patient specifically denies any chest pain at rest exertion, dyspnea at rest or with exertion, orthopnea, paroxysmal nocturnal dyspnea, syncope, palpitations, focal neurological deficits, intermittent claudication, lower extremity edema, unexplained weight gain, cough, hemoptysis or wheezing.  Her only real complaint is insomnia.  She falls asleep easily, but wakes up about 2 AM and cannot fall asleep again.  Past Medical History:  Diagnosis Date  . CAD (coronary artery disease)   .  COPD (chronic obstructive pulmonary disease) ([place])   . History of coronary vasospasm   . Hyperlipidemia   . Mitral valve prolapse    mild  . OAB (overactive bladder)   . Osteoporosis   . STEMI (ST elevation myocardial infarction) ([place]) 2011   at age 70 x 2 from coronary vasospasm; x 1 (2011)  . Venous insufficiency     Past Surgical History:  Procedure Laterality Date  . ABLATION SAPHENOUS VEIN W/ RFA  07/05/2010   left  . BREAST LUMPECTOMY     benign  . CARDIAC CATHETERIZATION  03/07/2009   Normal coronaries  . CATARACT EXTRACTION     bilateral eyes  . COLONOSCOPY     history of polyps  . DILATION AND CURETTAGE OF UTERUS      X 2  . MELANOMA EXCISION    . NM MYOCAR PERF WALL MOTION  05/23/2008   No significant ischemia  . US ECHOCARDIOGRAPHY  05/23/2008   MVP,mild mitral annular ca+,mild MR,AOV mildlly sclerotic    Current Medications: Outpatient Medications Prior to Visit  Medication Sig Dispense Refill  . Arginine 500 MG CAPS Take 2 capsules by mouth daily. L-Arginine    . Ascorbic Acid (VITAMIN C) 1000 MG tablet Take 1,000 mg by mouth 2 (two) times daily. Take 2 tablet a day    . aspirin 81 MG tablet Take 162 mg by mouth daily.     . B Complex Vitamins (B-COMPLEX/B-12 PO) Take 1 capsule by mouth daily.      . Calcium Citrate-Vitamin D (  CALCIUM CITRATE + D PO) Take 500 mg by mouth.    . Cholecalciferol (VITAMIN D) 1000 UNITS capsule Take 2,000 Units by mouth daily.     . Coenzyme Q10 (CO Q 10) 100 MG CAPS Take 1 capsule by mouth daily.    . fish oil-omega-3 fatty acids 1000 MG capsule Take 1 g by mouth daily.     Nyoka Cowden Tea, Camillia sinensis, ([employer] SUPER GREEN TEA EXTRACT) 250 MG CAPS Take 1 capsule by mouth 2 times daily at 12 noon and 4 pm. Takes 350 egcg bid    . L-Lysine 500 MG CAPS Take 1 capsule by mouth daily.    Marland Kitchen levothyroxine (SYNTHROID, LEVOTHROID) 50 MCG tablet TAKE ONE TABLET BY MOUTH ONCE DAILY 90 tablet 3  . Multiple Vitamin (MULTIVITAMIN) capsule Take 1  capsule by mouth daily.      . nitroGLYCERIN (NITROSTAT) 0.4 MG SL tablet Place 1 tablet (0.4 mg total) under the tongue every 5 (five) minutes as needed for chest pain. 25 tablet 3   Facility-Administered Medications Prior to Visit  Medication Dose Route Frequency Provider Last Rate Last Dose  . 0.9 %  sodium chloride infusion  500 mL Intravenous Continuous Irene Shipper, MD         Allergies:   Bactrim [sulfamethoxazole-trimethoprim]; Codeine; and Hydrochlorothiazide   Social History   Social History  . Marital status: Married    Spouse name: N/A  . Number of children: N/A  . Years of education: N/A   Social History Main Topics  . Smoking status: Former Smoker    Types: Cigarettes    Quit date: 08/03/1988  . Smokeless tobacco: Never Used  . Alcohol use No  . Drug use: No  . Sexual activity: Not Asked   Other Topics Concern  . None   Social History Narrative  . None     Family History:  The patient's family history includes Alcohol abuse in her mother; Colon cancer in her maternal uncle; Colon cancer (age of onset: 64) in her maternal aunt; Diabetes in her son; Healthy in her son and son; Heart disease (age of onset: 42) in her sister; Heart failure in her father; Neuropathy in her son.   ROS:   Please see the history of present illness.    ROS All other systems reviewed and are negative.   PHYSICAL EXAM:   VS:  BP 124/74   Pulse 70   Ht 5\' 3"  (1.6 m)   Wt 124 lb (56.2 kg)   BMI 21.97 kg/m     General: Alert, oriented x3, no distress, lean Head: no evidence of trauma, PERRL, EOMI, no exophtalmos or lid lag, no myxedema, no xanthelasma; normal ears, nose and oropharynx Neck: normal jugular venous pulsations and no hepatojugular reflux; brisk carotid pulses without delay and no carotid bruits Chest: clear to auscultation, no signs of consolidation by percussion or palpation, normal fremitus, symmetrical and full respiratory excursions Cardiovascular: normal position  and quality of the apical impulse, regular rhythm, normal first and second heart sounds, no murmurs, rubs or gallops Abdomen: no tenderness or distention, no masses by palpation, no abnormal pulsatility or arterial bruits, normal bowel sounds, no hepatosplenomegaly Extremities: no clubbing, cyanosis or edema; 2+ radial, ulnar and brachial pulses bilaterally; 2+ right femoral, posterior tibial and dorsalis pedis pulses; 2+ left femoral, posterior tibial and dorsalis pedis pulses; no subclavian or femoral bruits Neurological: grossly nonfocal Psych: Normal mood and affect   Wt Readings from Last 3 Encounters:  05/31/17 124 lb (56.2 kg)  05/05/17 124 lb (56.2 kg)  01/25/17 126 lb (57.2 kg)      Studies/Labs Reviewed:   EKG:  EKG is ordered today.  It shows normal sinus rhythm and is a normal tracing. QTC 419 ms.  Questionable left atrial abnormality  Recent Labs: 01/13/2017: ALT 22; BUN 12; Creatinine, Ser 0.87; Hemoglobin 14.7; Platelets 282; Potassium 4.9; Sodium 140; TSH 2.090   Lipid Panel    Component Value Date/Time   CHOL 290 (H) 10/29/2016 0955   CHOL 171 02/21/2013 1031   TRIG 204 (H) 10/29/2016 0955   TRIG 197 (H) 11/28/2013 0803   TRIG 148 02/21/2013 1031   HDL 76 10/29/2016 0955   HDL 74 11/28/2013 0803   HDL 56 02/21/2013 1031   CHOLHDL 3.9 03/09/2016 1137   CHOLHDL 2.0 03/07/2009 1300   VLDL 19 03/07/2009 1300   LDLCALC 173 (H) 10/29/2016 0955   LDLCALC 157 (H) 11/28/2013 0803   LDLCALC 85 02/21/2013 1031     ASSESSMENT:    1. Coronary artery disease involving native coronary artery of native heart without angina pectoris   2. Coronary vasospasm (HCC)   3. Pure hypercholesterolemia      PLAN:  In order of problems listed above:  1. CAD: No significant atherosclerotic lesions were seen at her last cardiac catheterization 2010 and is presumed that her previous myocardial infarction has happened due to vasospasm. She quit smoking many years ago.  Hyperlipidemia remains the major risk factor for future events.  Fortunately she has been intolerant of numerous lipid-lowering medications and I do not think we have other options.  We discussed Repatha or Praluent, but the cost of this is unreasonably high for a patient without established vascular disease.    Medication Adjustments/Labs and Tests Ordered: Current medicines are reviewed at length with the patient today.  Concerns regarding medicines are outlined above.  Medication changes, Labs and Tests ordered today are listed in the Patient Instructions below. Patient Instructions  Dr Sallyanne Kuster recommends that you schedule a follow-up appointment in 12 months. You will receive a reminder letter in the mail two months in advance. If you don't receive a letter, please call our office to schedule the follow-up appointment.  If you need a refill on your cardiac medications before your next appointment, please call your pharmacy.    Signed, Sanda Klein, MD  05/31/2017 6:25 PM    [address], [place], [place]  [address] Phone: [phone]; Fax: [phone]

## 2017-05-31 NOTE — Patient Instructions (Signed)
Dr Croitoru recommends that you schedule a follow-up appointment in 12 months. You will receive a reminder letter in the mail two months in advance. If you don't receive a letter, please call our office to schedule the follow-up appointment.  If you need a refill on your cardiac medications before your next appointment, please call your pharmacy. 

## 2017-07-19 DIAGNOSIS — D235 Other benign neoplasm of skin of trunk: Secondary | ICD-10-CM | POA: Diagnosis not present

## 2017-07-19 DIAGNOSIS — Z85828 Personal history of other malignant neoplasm of skin: Secondary | ICD-10-CM | POA: Diagnosis not present

## 2017-07-19 DIAGNOSIS — L570 Actinic keratosis: Secondary | ICD-10-CM | POA: Diagnosis not present

## 2017-08-27 ENCOUNTER — Ambulatory Visit: Payer: Medicare Other | Admitting: Family Medicine

## 2017-08-30 ENCOUNTER — Encounter: Payer: Self-pay | Admitting: Family Medicine

## 2017-08-30 ENCOUNTER — Ambulatory Visit (INDEPENDENT_AMBULATORY_CARE_PROVIDER_SITE_OTHER): Payer: Medicare Other | Admitting: Family Medicine

## 2017-08-30 VITALS — BP 148/86 | HR 78 | Temp 97.3°F | Ht 63.0 in | Wt 125.0 lb

## 2017-08-30 DIAGNOSIS — E039 Hypothyroidism, unspecified: Secondary | ICD-10-CM | POA: Diagnosis not present

## 2017-08-30 MED ORDER — LEVOTHYROXINE SODIUM 50 MCG PO TABS: 50.0000 ug | ORAL_TABLET | Freq: Every day | ORAL | 3 refills | Status: DC

## 2017-08-30 NOTE — Progress Notes (Signed)
Subjective:  Patient ID: Christine Sandoval, female    DOB: [birth year]  Age: 77 y.o. MRN: [id number]  CC: Hypothyroidism (pt here today for routine follow up of her thyroid and also has questions about Levothyroxine as she now has thinning hair, brittle nails, foggy brain, sleeping problems and bruising.)   HPI Bellatrix Devonshire presents for patient presents for follow-up on  thyroid. The patient has a history of hypothyroidism for many years. It has been stable recently. Pt. notes that she has had some thinning of her hair.  Her nails her brain seems foggy then that she is forgetful at times.  She is bruising too easily and she does not sleep well at night. Energy level has been adequate to good. Patient denies constipation and diarrhea. No myxedema. Medication is as noted below. Verified that pt is taking it daily on an empty stomach. Well tolerated.  Depression screen [facility] 2/9 08/30/2017 05/05/2017 01/25/2017  Decreased Interest 0 0 0  Down, Depressed, Hopeless 0 0 0  PHQ - 2 Score 0 0 0    History Khamani has a past medical history of CAD (coronary artery disease), COPD (chronic obstructive pulmonary disease) (HCC), History of coronary vasospasm, Hyperlipidemia, Mitral valve prolapse, OAB (overactive bladder), Osteoporosis, STEMI (ST elevation myocardial infarction) ([place]) (2011), and Venous insufficiency.   She has a past surgical history that includes Colonoscopy; Melanoma excision; Breast lumpectomy; Dilation and curettage of uterus; Cataract extraction; Cardiac catheterization (03/07/2009); Ablation saphenous vein w/ RFA (07/05/2010); US ECHOCARDIOGRAPHY (05/23/2008); and NM MYOCAR PERF WALL MOTION (05/23/2008).   Her family history includes Alcohol abuse in her mother; Colon cancer in her maternal uncle; Colon cancer (age of onset: 47) in her maternal aunt; Diabetes in her son; Healthy in her son and son; Heart disease (age of onset: 46) in her sister; Heart failure in her father; Neuropathy in her son.She  reports that she quit smoking about 29 years ago. Her smoking use included cigarettes. she has never used smokeless tobacco. She reports that she does not drink alcohol or use drugs.    ROS Review of Systems  Constitutional: Negative for activity change, appetite change and fever.  HENT: Negative for congestion, rhinorrhea and sore throat.   Eyes: Negative for visual disturbance.  Respiratory: Negative for cough and shortness of breath.   Cardiovascular: Negative for chest pain and palpitations.  Gastrointestinal: Negative for abdominal pain, diarrhea and nausea.  Genitourinary: Negative for dysuria.  Musculoskeletal: Negative for arthralgias and myalgias.    Objective:  BP (!) 148/86   Pulse 78   Temp (!) 97.3 F (36.3 C) (Oral)   Ht '5\' 3"'  (1.6 m)   Wt 125 lb (56.7 kg)   BMI 22.14 kg/m   BP Readings from Last 3 Encounters:  08/30/17 (!) 148/86  05/31/17 124/74  05/05/17 128/79    Wt Readings from Last 3 Encounters:  08/30/17 125 lb (56.7 kg)  05/31/17 124 lb (56.2 kg)  05/05/17 124 lb (56.2 kg)     Physical Exam  Constitutional: She is oriented to person, place, and time. She appears well-developed and well-nourished. No distress.  HENT:  Head: Normocephalic and atraumatic.  Right Ear: External ear normal.  Left Ear: External ear normal.  Nose: Nose normal.  Mouth/Throat: Oropharynx is clear and moist.  Eyes: Conjunctivae and EOM are normal. Pupils are equal, round, and reactive to light.  Neck: Normal range of motion. Neck supple. No thyromegaly present.  Cardiovascular: Normal rate, regular rhythm and normal heart sounds.  No  murmur heard. Pulmonary/Chest: Effort normal and breath sounds normal. No respiratory distress. She has no wheezes. She has no rales.  Abdominal: Soft. Bowel sounds are normal. She exhibits no distension. There is no tenderness.  Lymphadenopathy:    She has no cervical adenopathy.  Neurological: She is alert and oriented to person,  place, and time. She has normal reflexes.  Skin: Skin is warm and dry.  Psychiatric: She has a normal mood and affect. Her behavior is normal. Judgment and thought content normal.      Assessment & Plan:   Waver was seen today for hypothyroidism.  Diagnoses and all orders for this visit:  Acquired hypothyroidism -     CMP14+EGFR -     TSH -     T3, Free -     T4, Free  Other orders -     levothyroxine (SYNTHROID, LEVOTHROID) 50 MCG tablet; Take 1 tablet (50 mcg total) by mouth daily.   Based on the Patient's current symptoms there may be some issues with an underactive thyroid.  Possibly even normal T4 without adequate T3.  Will base medication adjustments on her symptoms noted above as well as her thyroid test level when results are available to the tests ordered above.   I have changed Nasiah Medford's levothyroxine. I am also having her maintain her aspirin, multivitamin, Vitamin D, fish oil-omega-3 fatty acids, vitamin C, B Complex Vitamins (B-COMPLEX/B-12 PO), Co Q 10, Arginine, Green Tea (Camillia sinensis), Calcium Citrate-Vitamin D (CALCIUM CITRATE + D PO), L-Lysine, and nitroGLYCERIN. We will continue to administer sodium chloride.  Allergies as of 08/30/2017      Reactions   Bactrim [sulfamethoxazole-trimethoprim] Rash   Codeine Other (See Comments)   PT FEELS CRAZY ON IT   Hydrochlorothiazide Other (See Comments), Cough      Medication List        Accurate as of 08/30/17  6:07 PM. Always use your most recent med list.          Arginine 500 MG Caps Take 2 capsules by mouth daily. L-Arginine   aspirin 81 MG tablet Take 162 mg by mouth daily.   B-COMPLEX/B-12 PO Take 1 capsule by mouth daily.   CALCIUM CITRATE + D PO Take 500 mg by mouth.   Co Q 10 100 MG Caps Take 1 capsule by mouth daily.   [employer] SUPER GREEN TEA EXTRACT 250 MG Caps Generic drug:  Green Tea (Camillia sinensis) Take 1 capsule by mouth 2 times daily at 12 noon and 4 pm. Takes 350 egcg bid    fish oil-omega-3 fatty acids 1000 MG capsule Take 1 g by mouth daily.   L-Lysine 500 MG Caps Take 1 capsule by mouth daily.   levothyroxine 50 MCG tablet Commonly known as:  SYNTHROID, LEVOTHROID Take 1 tablet (50 mcg total) by mouth daily.   multivitamin capsule Take 1 capsule by mouth daily.   nitroGLYCERIN 0.4 MG SL tablet Commonly known as:  NITROSTAT Place 1 tablet (0.4 mg total) under the tongue every 5 (five) minutes as needed for chest pain.   vitamin C 1000 MG tablet Take 1,000 mg by mouth 2 (two) times daily. Take 2 tablet a day   Vitamin D 1000 units capsule Take 2,000 Units by mouth daily.        Follow-up: Return in about 6 months (around 02/27/2018).  Claretta Fraise, M.D.

## 2017-08-31 DIAGNOSIS — Z1231 Encounter for screening mammogram for malignant neoplasm of breast: Secondary | ICD-10-CM | POA: Diagnosis not present

## 2017-08-31 LAB — CMP14+EGFR
ALBUMIN: 4.6 g/dL (ref 3.5–4.8)
ALT: 22 IU/L (ref 0–32)
AST: 26 IU/L (ref 0–40)
Albumin/Globulin Ratio: 2.1 (ref 1.2–2.2)
Alkaline Phosphatase: 70 IU/L (ref 39–117)
BUN / CREAT RATIO: 22 (ref 12–28)
BUN: 17 mg/dL (ref 8–27)
Bilirubin Total: 0.3 mg/dL (ref 0.0–1.2)
CO2: 25 mmol/L (ref 20–29)
CREATININE: 0.78 mg/dL (ref 0.57–1.00)
Calcium: 9.9 mg/dL (ref 8.7–10.3)
Chloride: 102 mmol/L (ref 96–106)
GFR calc non Af Amer: 75 mL/min/{1.73_m2} (ref >59)
GFR, EST AFRICAN AMERICAN: 87 mL/min/{1.73_m2} (ref >59)
Globulin, Total: 2.2 g/dL (ref 1.5–4.5)
Glucose: 95 mg/dL (ref 65–99)
Potassium: 5.2 mmol/L (ref 3.5–5.2)
Sodium: 144 mmol/L (ref 134–144)
TOTAL PROTEIN: 6.8 g/dL (ref 6.0–8.5)

## 2017-08-31 LAB — TSH: TSH: 1.78 u[IU]/mL (ref 0.450–4.500)

## 2017-08-31 LAB — T3, FREE: T3, Free: 2.9 pg/mL (ref 2.0–4.4)

## 2017-08-31 LAB — T4, FREE: Free T4: 1.46 ng/dL (ref 0.82–1.77)

## 2017-09-02 ENCOUNTER — Encounter: Payer: Self-pay | Admitting: Family Medicine

## 2017-10-13 ENCOUNTER — Telehealth: Payer: Self-pay | Admitting: Family Medicine

## 2017-10-13 NOTE — Telephone Encounter (Signed)
Patient states that she believes this office BP readings are off.

## 2017-11-04 ENCOUNTER — Encounter: Payer: Self-pay | Admitting: *Deleted

## 2018-01-03 ENCOUNTER — Emergency Department (HOSPITAL_COMMUNITY)
Admission: EM | Admit: 2018-01-03 | Discharge: 2018-01-03 | Disposition: A | Discharge status: Home / Self Care | Payer: Medicare Other | Attending: Emergency Medicine | Admitting: Emergency Medicine

## 2018-01-03 ENCOUNTER — Encounter (HOSPITAL_COMMUNITY): Payer: Self-pay | Admitting: Emergency Medicine

## 2018-01-03 ENCOUNTER — Emergency Department (HOSPITAL_COMMUNITY): Payer: Medicare Other

## 2018-01-03 DIAGNOSIS — J449 Chronic obstructive pulmonary disease, unspecified: Secondary | ICD-10-CM | POA: Insufficient documentation

## 2018-01-03 DIAGNOSIS — Y9301 Activity, walking, marching and hiking: Secondary | ICD-10-CM | POA: Insufficient documentation

## 2018-01-03 DIAGNOSIS — Z87891 Personal history of nicotine dependence: Secondary | ICD-10-CM | POA: Diagnosis not present

## 2018-01-03 DIAGNOSIS — I252 Old myocardial infarction: Secondary | ICD-10-CM | POA: Insufficient documentation

## 2018-01-03 DIAGNOSIS — S022XXA Fracture of nasal bones, initial encounter for closed fracture: Secondary | ICD-10-CM | POA: Insufficient documentation

## 2018-01-03 DIAGNOSIS — Y998 Other external cause status: Secondary | ICD-10-CM | POA: Diagnosis not present

## 2018-01-03 DIAGNOSIS — I2510 Atherosclerotic heart disease of native coronary artery without angina pectoris: Secondary | ICD-10-CM | POA: Insufficient documentation

## 2018-01-03 DIAGNOSIS — Y92017 Garden or yard in single-family (private) house as the place of occurrence of the external cause: Secondary | ICD-10-CM | POA: Insufficient documentation

## 2018-01-03 DIAGNOSIS — S0993XA Unspecified injury of face, initial encounter: Secondary | ICD-10-CM | POA: Diagnosis present

## 2018-01-03 DIAGNOSIS — W01198A Fall on same level from slipping, tripping and stumbling with subsequent striking against other object, initial encounter: Secondary | ICD-10-CM | POA: Diagnosis not present

## 2018-01-03 MED ORDER — OXYMETAZOLINE HCL 0.05 % NA SOLN: 1.0000 | Freq: Two times a day (BID) | NASAL | 0 refills | Status: DC

## 2018-01-03 NOTE — ED Provider Notes (Signed)
Emergency Department Provider Note   I have reviewed the triage vital signs and the nursing notes.   HISTORY  Chief Complaint Facial Injury   HPI Christine Sandoval is a 77 y.o. female of medical problems documented below the presents the emergency department today after a mechanical fall.  Her sandal caught on her deck and she fell forward hitting her nose on some raised piece of trim in her doorway.  She initially had some bleeding but that has improved.  She also has pain in the area.  She has a little bit of stuffiness on the right side but is able to breathe in both nares.  Did not pass out or hit her head.  No pain elsewhere.  No blood thinners. No other associated or modifying symptoms.    Past Medical History:  Diagnosis Date  . CAD (coronary artery disease)   . COPD (chronic obstructive pulmonary disease) ([place])   . History of coronary vasospasm   . Hyperlipidemia   . Mitral valve prolapse    mild  . OAB (overactive bladder)   . Osteoporosis   . STEMI (ST elevation myocardial infarction) ([place]) 2011   at age 13 x 2 from coronary vasospasm; x 1 (2011)  . Venous insufficiency     Patient Active Problem List   Diagnosis Date Noted  . Vitamin D deficiency 02/19/2014  . Osteoporosis, postmenopausal 02/21/2013  . COPD (chronic obstructive pulmonary disease) ([place]) 02/21/2013  . HLD (hyperlipidemia) 02/21/2013  . Coronary vasospasm ([place]) 02/21/2013  . CAD (coronary artery disease) 02/21/2013    Past Surgical History:  Procedure Laterality Date  . ABLATION SAPHENOUS VEIN W/ RFA  07/05/2010   left  . BREAST LUMPECTOMY     benign  . CARDIAC CATHETERIZATION  03/07/2009   Normal coronaries  . CATARACT EXTRACTION     bilateral eyes  . COLONOSCOPY     history of polyps  . DILATION AND CURETTAGE OF UTERUS      X 2  . MELANOMA EXCISION    . NM MYOCAR PERF WALL MOTION  05/23/2008   No significant ischemia  . US ECHOCARDIOGRAPHY  05/23/2008   MVP,mild mitral annular  ca+,mild MR,AOV mildlly sclerotic    Current Outpatient Rx  . Order #: [id number] Class: Historical Med  . Order #: [id number] Class: Historical Med  . Order #: [id number] Class: Historical Med  . Order #: [id number] Class: Historical Med  . Order #: [id number] Class: Historical Med  . Order #: [id number] Class: Historical Med  . Order #: [id number] Class: Historical Med  . Order #: [id number] Class: Historical Med  . Order #: [id number] Class: Historical Med  . Order #: [id number] Class: Historical Med  . Order #: [id number] Class: Normal  . Order #: [id number] Class: Historical Med  . Order #: [id number] Class: Normal  . Order #: [id number] Class: Print    Allergies Bactrim [sulfamethoxazole-trimethoprim]; Codeine; and Hydrochlorothiazide  Family History  Problem Relation Age of Onset  . Colon cancer Maternal Aunt 75  . Alcohol abuse Mother   . Heart failure Father   . Colon cancer Maternal Uncle   . Heart disease Sister 7       CHF  . Healthy Son   . Healthy Son   . Diabetes Son   . Neuropathy Son     Social History Social History   Tobacco Use  . Smoking status: Former Smoker    Types: Cigarettes    Last attempt to quit: 08/03/1988    Years since quitting: 29.4  . Smokeless tobacco: Never Used  Substance Use Topics  . Alcohol use: No  . Drug use: No    Review of Systems  All other systems negative except as documented in the HPI. All pertinent positives and negatives as reviewed in the HPI. ____________________________________________   PHYSICAL EXAM:  VITAL SIGNS: ED Triage Vitals  Enc Vitals Group     BP 01/03/18 1300 (!) 157/84     Pulse Rate 01/03/18 1300 70     Resp 01/03/18 1300 18     Temp 01/03/18 1300 97.6 F (36.4 C)     Temp Source 01/03/18 1300 Tympanic     SpO2 01/03/18 1300 97 %     Weight 01/03/18 1259 117 lb (53.1 kg)     Height 01/03/18 1259 5' 3.5" (1.613 m)     Head Circumference --      Peak Flow --      Pain Score 01/03/18 1301 2     Pain Loc --      Pain  Edu? --      Excl. in [place]? --     Constitutional: Alert and oriented. Well appearing and in no acute distress. Eyes: Conjunctivae are normal. PERRL. EOMI. Head: ecchymosis around nose and some under eyes. Small abrasion to nose with deformity noticed. . Nose: No congestion/rhinnorhea. No septal hematoma. Clear nares. Hemostatic.  Mouth/Throat: Mucous membranes are moist.  Oropharynx non-erythematous. Neck: No stridor.  No meningeal signs.   Cardiovascular: Normal rate, regular rhythm. Good peripheral circulation. Grossly normal heart sounds.   Respiratory: Normal respiratory effort.  No retractions. Lungs CTAB. Gastrointestinal: Soft and nontender. No distention.  Musculoskeletal: No lower extremity tenderness nor edema. No gross deformities of extremities. Neurologic:  Normal speech and language. No gross focal neurologic deficits are appreciated.  Skin:  Skin is warm, dry and intact. No rash noted.  ____________________________________________  [id number]  Dg Nasal Bones  Result Date: 01/03/2018 CLINICAL DATA:  Pain following fall EXAM: NASAL BONES - 3+ VIEW COMPARISON:  None. FINDINGS: Right lateral, left lateral, and [place] views obtained. There is a fracture of the proximal nasion with depression of the more distal fracture fragment. No other fractures are evident. No frank dislocation. Visualized paranasal sinuses are clear. IMPRESSION: Fracture of the proximal nasion with depression at the fracture site and inferior displacement distally. No frank dislocation. Visualized paranasal sinuses clear. Electronically Signed   By: Lowella Grip III M.D.   On: 01/03/2018 13:19   ____________________________________________   INITIAL IMPRESSION / ASSESSMENT AND PLAN / ED COURSE  Nasal fracture with depression but no dislocation. Hemostatic. No hematomas. No airway occlusion. Will dc to fu w/ ENT, ice/afrin.   Pertinent labs & imaging results that were available during my care of the  patient were reviewed by me and considered in my medical decision making (see chart for details).  ____________________________________________  FINAL CLINICAL IMPRESSION(S) / ED DIAGNOSES  Final diagnoses:  Closed fracture of nasal bone, initial encounter     MEDICATIONS GIVEN DURING THIS VISIT:  Medications - No data to display   NEW OUTPATIENT MEDICATIONS STARTED DURING THIS VISIT:  Discharge Medication List as of 01/03/2018  4:52 PM    START taking these medications   Details  oxymetazoline (AFRIN NASAL SPRAY) 0.05 % nasal spray Place 1 spray into both nostrils 2 (two) times daily., Starting Mon 01/03/2018, Print        Note:  This note was prepared with assistance of Dragon voice recognition software. Occasional wrong-word or sound-a-like substitutions may have occurred due  to the inherent limitations of voice recognition software.   Merrily Pew, MD 01/03/18 [phone]

## 2018-01-03 NOTE — ED Triage Notes (Signed)
Pt states her sandal caught on the deck and she fell and hit the bridge of her nose on the sliding glass door track.  Deformity noted with small lac.

## 2018-01-10 DIAGNOSIS — S022XXA Fracture of nasal bones, initial encounter for closed fracture: Secondary | ICD-10-CM | POA: Diagnosis not present

## 2018-01-13 DIAGNOSIS — W19XXXA Unspecified fall, initial encounter: Secondary | ICD-10-CM | POA: Diagnosis not present

## 2018-01-13 DIAGNOSIS — S022XXA Fracture of nasal bones, initial encounter for closed fracture: Secondary | ICD-10-CM | POA: Diagnosis not present

## 2018-01-13 DIAGNOSIS — W01198A Fall on same level from slipping, tripping and stumbling with subsequent striking against other object, initial encounter: Secondary | ICD-10-CM | POA: Diagnosis not present

## 2018-02-25 ENCOUNTER — Encounter: Payer: Self-pay | Admitting: Family Medicine

## 2018-02-25 ENCOUNTER — Ambulatory Visit (INDEPENDENT_AMBULATORY_CARE_PROVIDER_SITE_OTHER): Payer: Medicare Other | Admitting: Family Medicine

## 2018-02-25 VITALS — BP 132/84 | HR 72 | Temp 97.0°F | Ht 63.5 in | Wt 119.0 lb

## 2018-02-25 DIAGNOSIS — E785 Hyperlipidemia, unspecified: Secondary | ICD-10-CM

## 2018-02-25 DIAGNOSIS — E782 Mixed hyperlipidemia: Secondary | ICD-10-CM

## 2018-02-25 DIAGNOSIS — E039 Hypothyroidism, unspecified: Secondary | ICD-10-CM | POA: Diagnosis not present

## 2018-02-25 DIAGNOSIS — I8393 Asymptomatic varicose veins of bilateral lower extremities: Secondary | ICD-10-CM

## 2018-02-25 MED ORDER — NITROGLYCERIN 0.4 MG SL SUBL: 0.4000 mg | SUBLINGUAL_TABLET | SUBLINGUAL | 3 refills | Status: AC | PRN

## 2018-02-25 MED ORDER — LEVOTHYROXINE SODIUM 50 MCG PO TABS: 50.0000 ug | ORAL_TABLET | Freq: Every day | ORAL | 1 refills | Status: DC

## 2018-02-25 NOTE — Progress Notes (Signed)
Subjective:  Patient ID: Christine Sandoval, female    DOB: 1940-09-20  Age: 77 y.o. MRN: [id number]  CC: Medical Management of Chronic Issues   HPI Christine Sandoval presents for patient presents for follow-up on  thyroid. The patient has a history of hypothyroidism for many years. It has been stable recently. Pt. denies any change in  voice, loss of hair, heat or cold intolerance. Energy level has been adequate to good. Patient denies constipation and diarrhea. No myxedema. Medication is as noted below. Verified that pt is taking it daily on an empty stomach. Well tolerated.  Patient is not taking medication for cholesterol and states she is opted to use red rice yeast.  She is concerned about the varicosities in her legs.  They seem to be getting bigger but she denies pain.  Depression screen [facility] 2/9 02/25/2018 08/30/2017 05/05/2017  Decreased Interest 0 0 0  Down, Depressed, Hopeless 0 0 0  PHQ - 2 Score 0 0 0    History Christine Sandoval has a past medical history of CAD (coronary artery disease), COPD (chronic obstructive pulmonary disease) (HCC), History of coronary vasospasm, Hyperlipidemia, Mitral valve prolapse, OAB (overactive bladder), Osteoporosis, STEMI (ST elevation myocardial infarction) ([place]) (2011), and Venous insufficiency.   She has a past surgical history that includes Colonoscopy; Melanoma excision; Breast lumpectomy; Dilation and curettage of uterus; Cataract extraction; Cardiac catheterization (03/07/2009); Ablation saphenous vein w/ RFA (07/05/2010); US ECHOCARDIOGRAPHY (05/23/2008); and NM MYOCAR PERF WALL MOTION (05/23/2008).   Her family history includes Alcohol abuse in her mother; Colon cancer in her maternal uncle; Colon cancer (age of onset: 97) in her maternal aunt; Diabetes in her son; Healthy in her son and son; Heart disease (age of onset: 51) in her sister; Heart failure in her father; Neuropathy in her son.She reports that she quit smoking about 29 years ago. Her smoking use  included cigarettes. She has never used smokeless tobacco. She reports that she does not drink alcohol or use drugs.    ROS Review of Systems  Constitutional: Negative.   HENT: Negative for congestion.   Eyes: Negative for visual disturbance.  Respiratory: Negative for shortness of breath.   Cardiovascular: Negative for chest pain.  Gastrointestinal: Negative for abdominal pain, constipation, diarrhea, nausea and vomiting.  Genitourinary: Negative for difficulty urinating.  Musculoskeletal: Negative for arthralgias and myalgias.  Neurological: Negative for headaches.  Psychiatric/Behavioral: Negative for sleep disturbance.    Objective:  BP 132/84   Pulse 72   Temp (!) 97 F (36.1 C) (Oral)   Ht 5' 3.5" (1.613 m)   Wt 119 lb (54 kg)   BMI 20.75 kg/m   BP Readings from Last 3 Encounters:  02/25/18 132/84  01/03/18 (!) 159/76  08/30/17 (!) 148/86    Wt Readings from Last 3 Encounters:  02/25/18 119 lb (54 kg)  01/03/18 117 lb (53.1 kg)  08/30/17 125 lb (56.7 kg)     Physical Exam  Constitutional: She is oriented to person, place, and time. She appears well-developed and well-nourished. No distress.  HENT:  Head: Normocephalic and atraumatic.  Right Ear: External ear normal.  Left Ear: External ear normal.  Nose: Nose normal.  Mouth/Throat: Oropharynx is clear and moist.  Eyes: Pupils are equal, round, and reactive to light. Conjunctivae and EOM are normal.  Neck: Normal range of motion. Neck supple. No thyromegaly present.  Cardiovascular: Normal rate, regular rhythm, normal heart sounds and intact distal pulses.  No murmur heard. Moderate varicosities lower extremities particularly in the  calves.  Pulmonary/Chest: Effort normal and breath sounds normal. No respiratory distress. She has no wheezes. She has no rales.  Abdominal: Soft. There is no tenderness.  Lymphadenopathy:    She has no cervical adenopathy.  Neurological: She is alert and oriented to person,  place, and time. She has normal reflexes.  Skin: Skin is warm and dry.  Psychiatric: She has a normal mood and affect. Her behavior is normal. Judgment and thought content normal.      Assessment & Plan:   Christine Sandoval was seen today for medical management of chronic issues.  Diagnoses and all orders for this visit:  Acquired hypothyroidism -     Cancel: CMP14+EGFR -     Cancel: Lipid panel -     Cancel: Thyroid Panel With TSH -     Cancel: T4, Free  Mixed hyperlipidemia -     Cancel: CMP14+EGFR -     Cancel: Lipid panel  Hyperlipidemia, unspecified hyperlipidemia type -     Cancel: CMP14+EGFR -     Cancel: Lipid panel  Varicose veins of both lower extremities, unspecified whether complicated -     Ambulatory referral to Vascular Surgery  Other orders -     nitroGLYCERIN (NITROSTAT) 0.4 MG SL tablet; Place 1 tablet (0.4 mg total) under the tongue every 5 (five) minutes as needed for chest pain. -     levothyroxine (SYNTHROID, LEVOTHROID) 50 MCG tablet; Take 1 tablet (50 mcg total) by mouth daily.       I have discontinued Christine Sandoval's oxymetazoline. I am also having her maintain her aspirin, multivitamin, Vitamin D, fish oil-omega-3 fatty acids, vitamin C, B Complex Vitamins (B-COMPLEX/B-12 PO), Co Q 10, Arginine, Green Tea (Camellia sinensis), Calcium Citrate-Vitamin D (CALCIUM CITRATE + D PO), L-Lysine, nitroGLYCERIN, and levothyroxine. We will continue to administer sodium chloride.  Allergies as of 02/25/2018      Reactions   Bactrim [sulfamethoxazole-trimethoprim] Rash   Codeine Other (See Comments)   PT FEELS CRAZY ON IT   Hydrochlorothiazide Other (See Comments), Cough      Medication List        Accurate as of 02/25/18 11:59 PM. Always use your most recent med list.          Arginine 500 MG Caps Take 2 capsules by mouth daily. L-Arginine   aspirin 81 MG tablet Take 162 mg by mouth daily.   B-COMPLEX/B-12 PO Take 1 capsule by mouth daily.   CALCIUM  CITRATE + D PO Take 500 mg by mouth.   Co Q 10 100 MG Caps Take 1 capsule by mouth daily.   [employer] SUPER GREEN TEA EXTRACT 250 MG Caps Generic drug:  Green Tea (Camellia sinensis) Take 1 capsule by mouth 2 times daily at 12 noon and 4 pm. Takes 350 egcg bid   fish oil-omega-3 fatty acids 1000 MG capsule Take 1 g by mouth daily.   L-Lysine 500 MG Caps Take 1 capsule by mouth daily.   levothyroxine 50 MCG tablet Commonly known as:  SYNTHROID, LEVOTHROID Take 1 tablet (50 mcg total) by mouth daily.   multivitamin capsule Take 1 capsule by mouth daily.   nitroGLYCERIN 0.4 MG SL tablet Commonly known as:  NITROSTAT Place 1 tablet (0.4 mg total) under the tongue every 5 (five) minutes as needed for chest pain.   vitamin C 1000 MG tablet Take 1,000 mg by mouth 2 (two) times daily. Take 2 tablet a day   Vitamin D 1000 units capsule Take 2,000 Units  by mouth daily.        Follow-up: No follow-ups on file.  Claretta Fraise, M.D.

## 2018-02-26 ENCOUNTER — Other Ambulatory Visit: Payer: Medicare Other

## 2018-02-26 ENCOUNTER — Other Ambulatory Visit: Payer: Self-pay | Admitting: *Deleted

## 2018-02-26 DIAGNOSIS — E039 Hypothyroidism, unspecified: Secondary | ICD-10-CM | POA: Diagnosis not present

## 2018-02-26 DIAGNOSIS — E782 Mixed hyperlipidemia: Secondary | ICD-10-CM

## 2018-02-27 LAB — CMP14+EGFR
ALBUMIN: 4.3 g/dL (ref 3.5–4.8)
ALK PHOS: 71 IU/L (ref 39–117)
ALT: 18 IU/L (ref 0–32)
AST: 25 IU/L (ref 0–40)
Albumin/Globulin Ratio: 2 (ref 1.2–2.2)
BUN/Creatinine Ratio: 13 (ref 12–28)
BUN: 11 mg/dL (ref 8–27)
Bilirubin Total: 0.4 mg/dL (ref 0.0–1.2)
CO2: 25 mmol/L (ref 20–29)
CREATININE: 0.83 mg/dL (ref 0.57–1.00)
Calcium: 9.6 mg/dL (ref 8.7–10.3)
Chloride: 104 mmol/L (ref 96–106)
GFR calc Af Amer: 80 mL/min/{1.73_m2} (ref >59)
GFR calc non Af Amer: 70 mL/min/{1.73_m2} (ref >59)
GLUCOSE: 93 mg/dL (ref 65–99)
Globulin, Total: 2.1 g/dL (ref 1.5–4.5)
Potassium: 5.6 mmol/L — ABNORMAL HIGH (ref 3.5–5.2)
Sodium: 143 mmol/L (ref 134–144)
Total Protein: 6.4 g/dL (ref 6.0–8.5)

## 2018-02-27 LAB — LIPID PANEL
CHOLESTEROL TOTAL: 266 mg/dL — AB (ref 100–199)
Chol/HDL Ratio: 3.9 ratio (ref 0.0–4.4)
HDL: 69 mg/dL (ref >39)
LDL CALC: 160 mg/dL — AB (ref 0–99)
Triglycerides: 186 mg/dL — ABNORMAL HIGH (ref 0–149)
VLDL Cholesterol Cal: 37 mg/dL (ref 5–40)

## 2018-02-27 LAB — THYROID PANEL WITH TSH
Free Thyroxine Index: 2.8 (ref 1.2–4.9)
T3 Uptake Ratio: 29 % (ref 24–39)
T4, Total: 9.6 ug/dL (ref 4.5–12.0)
TSH: 1.04 u[IU]/mL (ref 0.450–4.500)

## 2018-02-27 LAB — T4, FREE: Free T4: 1.57 ng/dL (ref 0.82–1.77)

## 2018-02-28 ENCOUNTER — Ambulatory Visit: Payer: Medicare Other | Admitting: Family Medicine

## 2018-03-08 ENCOUNTER — Encounter: Payer: Self-pay | Admitting: Family Medicine

## 2018-03-10 ENCOUNTER — Encounter: Payer: Self-pay | Admitting: Family Medicine

## 2018-03-10 ENCOUNTER — Ambulatory Visit (INDEPENDENT_AMBULATORY_CARE_PROVIDER_SITE_OTHER): Payer: Medicare Other | Admitting: Family Medicine

## 2018-03-10 VITALS — BP 164/92 | HR 72 | Temp 97.2°F | Ht 63.5 in | Wt 118.2 lb

## 2018-03-10 DIAGNOSIS — E039 Hypothyroidism, unspecified: Secondary | ICD-10-CM

## 2018-03-10 DIAGNOSIS — L659 Nonscarring hair loss, unspecified: Secondary | ICD-10-CM

## 2018-03-10 MED ORDER — LEVOTHYROXINE SODIUM 75 MCG PO TABS: 75.0000 ug | ORAL_TABLET | Freq: Every day | ORAL | 2 refills | Status: DC

## 2018-03-10 NOTE — Progress Notes (Signed)
Chief Complaint  Patient presents with  . Alopecia    pt here today c/o hair loss    HPI  Patient presents today for continued hair loss.  We had recently checked her thyroid and found it to be in normal range.  However she continues to note increasing hair loss.  Today she washed her hair and when she tried it she noticed health and it was.  She had vacuumed her floor yesterday and found a tremendous amount of her own hair on the floor.  She is concerned that it solving a fall out soon.  She has had no symptoms otherwise regarding fever chills sweats energy loss etc.  No use of suspected medicines.  She actually takes a supplement in the form of arginine that can help prevent hair loss. PMH: Smoking status noted ROS: Review of Systems  Constitutional: Negative.  Negative for activity change, appetite change and fever.  HENT: Negative.   Cardiovascular: Negative for chest pain.  Endocrine: Negative for cold intolerance and heat intolerance.  Musculoskeletal: Negative for arthralgias.    Objective: BP (!) 164/92   Pulse 72   Temp (!) 97.2 F (36.2 C) (Oral)   Ht 5' 3.5" (1.613 m)   Wt 118 lb 4 oz (53.6 kg)   BMI 20.62 kg/m  Gen: NAD, alert, cooperative with exam HEENT: NCAT, EOMI, PERRL CV: RRR, good S1/S2, no murmur Resp: CTABL, no wheezes, non-labored Ext: No edema, warm Neuro: Alert and oriented, No gross deficits  Assessment and plan:  1. Hair loss   2. Acquired hypothyroidism     Meds ordered this encounter  Medications  . levothyroxine (SYNTHROID, LEVOTHROID) 75 MCG tablet    Sig: Take 1 tablet (75 mcg total) by mouth daily.    Dispense:  30 tablet    Refill:  2    Orders Placed This Encounter  Procedures  . Ambulatory referral to Dermatology    Referral Priority:   Routine    Referral Type:   Consultation    Referral Reason:   Specialty Services Required    Requested Specialty:   Dermatology    Number of Visits Requested:   1   Increased arginine to 3000  mg daily and the vitamin C to 5000 units daily.  We will do a small increase in her thyroid dose to see if that is useful as well.  She will watch out for side effects as discussed of hyperthyroidism in case we overshoot. Follow up as needed.  Claretta Fraise, MD

## 2018-03-10 NOTE — Patient Instructions (Signed)
BP goal- less than 135/85  DASH Eating Plan DASH stands for "Dietary Approaches to Stop Hypertension." The DASH eating plan is a healthy eating plan that has been shown to reduce high blood pressure (hypertension). It may also reduce your risk for type 2 diabetes, heart disease, and stroke. The DASH eating plan may also help with weight loss. What are tips for following this plan? General guidelines  Avoid eating more than 2,300 mg (milligrams) of salt (sodium) a day. If you have hypertension, you may need to reduce your sodium intake to 1,500 mg a day.  Limit alcohol intake to no more than 1 drink a day for nonpregnant women and 2 drinks a day for men. One drink equals 12 oz of beer, 5 oz of wine, or 1 oz of hard liquor.  Work with your health care provider to maintain a healthy body weight or to lose weight. Ask what an ideal weight is for you.  Get at least 30 minutes of exercise that causes your heart to beat faster (aerobic exercise) most days of the week. Activities may include walking, swimming, or biking.  Work with your health care provider or diet and nutrition specialist (dietitian) to adjust your eating plan to your individual calorie needs. Reading food labels  Check food labels for the amount of sodium per serving. Choose foods with less than 5 percent of the Daily Value of sodium. Generally, foods with less than 300 mg of sodium per serving fit into this eating plan.  To find whole grains, look for the word "whole" as the first word in the ingredient list. Shopping  Buy products labeled as "low-sodium" or "no salt added."  Buy fresh foods. Avoid canned foods and premade or frozen meals. Cooking  Avoid adding salt when cooking. Use salt-free seasonings or herbs instead of table salt or sea salt. Check with your health care provider or pharmacist before using salt substitutes.  Do not fry foods. Cook foods using healthy methods such as baking, boiling, grilling, and  broiling instead.  Cook with heart-healthy oils, such as olive, canola, soybean, or sunflower oil. Meal planning   Eat a balanced diet that includes: ? 5 or more servings of fruits and vegetables each day. At each meal, try to fill half of your plate with fruits and vegetables. ? Up to 6-8 servings of whole grains each day. ? Less than 6 oz of lean meat, poultry, or fish each day. A 3-oz serving of meat is about the same size as a deck of cards. One egg equals 1 oz. ? 2 servings of low-fat dairy each day. ? A serving of nuts, seeds, or beans 5 times each week. ? Heart-healthy fats. Healthy fats called Omega-3 fatty acids are found in foods such as flaxseeds and coldwater fish, like sardines, salmon, and mackerel.  Limit how much you eat of the following: ? Canned or prepackaged foods. ? Food that is high in trans fat, such as fried foods. ? Food that is high in saturated fat, such as fatty meat. ? Sweets, desserts, sugary drinks, and other foods with added sugar. ? Full-fat dairy products.  Do not salt foods before eating.  Try to eat at least 2 vegetarian meals each week.  Eat more home-cooked food and less restaurant, buffet, and fast food.  When eating at a restaurant, ask that your food be prepared with less salt or no salt, if possible. What foods are recommended? The items listed may not be a complete list.  Talk with your dietitian about what dietary choices are best for you. Grains Whole-grain or whole-wheat bread. Whole-grain or whole-wheat pasta. Brown rice. [place]. Bulgur. Whole-grain and low-sodium cereals. Pita bread. Low-fat, low-sodium crackers. Whole-wheat flour tortillas. Vegetables Fresh or frozen vegetables (raw, steamed, roasted, or grilled). Low-sodium or reduced-sodium tomato and vegetable juice. Low-sodium or reduced-sodium tomato sauce and tomato paste. Low-sodium or reduced-sodium canned vegetables. Fruits All fresh, dried, or frozen fruit. Canned  fruit in natural juice (without added sugar). Meat and other protein foods Skinless chicken or [place]. Ground chicken or [place]. Pork with fat trimmed off. Fish and seafood. Egg whites. Dried beans, peas, or lentils. Unsalted nuts, nut butters, and seeds. Unsalted canned beans. Lean cuts of beef with fat trimmed off. Low-sodium, lean deli meat. Dairy Low-fat (1%) or fat-free (skim) milk. Fat-free, low-fat, or reduced-fat cheeses. Nonfat, low-sodium ricotta or cottage cheese. Low-fat or nonfat yogurt. Low-fat, low-sodium cheese. Fats and oils Soft margarine without trans fats. Vegetable oil. Low-fat, reduced-fat, or light mayonnaise and salad dressings (reduced-sodium). Canola, safflower, olive, soybean, and sunflower oils. Avocado. Seasoning and other foods Herbs. Spices. Seasoning mixes without salt. Unsalted popcorn and pretzels. Fat-free sweets. What foods are not recommended? The items listed may not be a complete list. Talk with your dietitian about what dietary choices are best for you. Grains Baked goods made with fat, such as croissants, muffins, or some breads. Dry pasta or rice meal packs. Vegetables Creamed or fried vegetables. Vegetables in a cheese sauce. Regular canned vegetables (not low-sodium or reduced-sodium). Regular canned tomato sauce and paste (not low-sodium or reduced-sodium). Regular tomato and vegetable juice (not low-sodium or reduced-sodium). Angie Fava. Olives. Fruits Canned fruit in a light or heavy syrup. Fried fruit. Fruit in cream or butter sauce. Meat and other protein foods Fatty cuts of meat. Ribs. Fried meat. Berniece Salines. Sausage. Bologna and other processed lunch meats. Salami. Fatback. Hotdogs. Bratwurst. Salted nuts and seeds. Canned beans with added salt. Canned or smoked fish. Whole eggs or egg yolks. Chicken or [place] with skin. Dairy Whole or 2% milk, cream, and half-and-half. Whole or full-fat cream cheese. Whole-fat or sweetened yogurt. Full-fat cheese.  Nondairy creamers. Whipped toppings. Processed cheese and cheese spreads. Fats and oils Butter. Stick margarine. Lard. Shortening. Ghee. Bacon fat. Tropical oils, such as coconut, palm kernel, or palm oil. Seasoning and other foods Salted popcorn and pretzels. Onion salt, garlic salt, seasoned salt, table salt, and sea salt. Worcestershire sauce. Tartar sauce. Barbecue sauce. Teriyaki sauce. Soy sauce, including reduced-sodium. Steak sauce. Canned and packaged gravies. Fish sauce. Oyster sauce. Cocktail sauce. Horseradish that you find on the shelf. Ketchup. Mustard. Meat flavorings and tenderizers. Bouillon cubes. Hot sauce and Tabasco sauce. Premade or packaged marinades. Premade or packaged taco seasonings. Relishes. Regular salad dressings. Where to find more information:  National Heart, Lung, and [place]: https://wilson-eaton.com/  American Heart Association: www.heart.org Summary  The DASH eating plan is a healthy eating plan that has been shown to reduce high blood pressure (hypertension). It may also reduce your risk for type 2 diabetes, heart disease, and stroke.  With the DASH eating plan, you should limit salt (sodium) intake to 2,300 mg a day. If you have hypertension, you may need to reduce your sodium intake to 1,500 mg a day.  When on the DASH eating plan, aim to eat more fresh fruits and vegetables, whole grains, lean proteins, low-fat dairy, and heart-healthy fats.  Work with your health care provider or diet and nutrition specialist (dietitian) to adjust  your eating plan to your individual calorie needs. This information is not intended to replace advice given to you by your health care provider. Make sure you discuss any questions you have with your health care provider. Document Released: 07/09/2011 Document Revised: 07/13/2016 Document Reviewed: 07/13/2016 Elsevier Interactive Patient Education  [employer].

## 2018-03-24 ENCOUNTER — Other Ambulatory Visit: Payer: Self-pay

## 2018-03-24 DIAGNOSIS — I83893 Varicose veins of bilateral lower extremities with other complications: Secondary | ICD-10-CM

## 2018-04-05 ENCOUNTER — Encounter: Payer: Medicare Other | Admitting: Vascular Surgery

## 2018-04-05 ENCOUNTER — Ambulatory Visit (HOSPITAL_COMMUNITY): Payer: Medicare Other | Attending: Vascular Surgery

## 2018-04-05 ENCOUNTER — Encounter: Payer: Self-pay | Admitting: Vascular Surgery

## 2018-05-12 DIAGNOSIS — R300 Dysuria: Secondary | ICD-10-CM | POA: Diagnosis not present

## 2018-05-12 DIAGNOSIS — N390 Urinary tract infection, site not specified: Secondary | ICD-10-CM | POA: Diagnosis not present

## 2018-06-06 ENCOUNTER — Ambulatory Visit (INDEPENDENT_AMBULATORY_CARE_PROVIDER_SITE_OTHER): Payer: Medicare Other | Admitting: Cardiovascular Disease

## 2018-06-06 ENCOUNTER — Encounter: Payer: Self-pay | Admitting: Cardiovascular Disease

## 2018-06-06 VITALS — BP 124/82 | HR 70 | Ht 63.5 in | Wt 116.0 lb

## 2018-06-06 DIAGNOSIS — I2510 Atherosclerotic heart disease of native coronary artery without angina pectoris: Secondary | ICD-10-CM | POA: Diagnosis not present

## 2018-06-06 DIAGNOSIS — E785 Hyperlipidemia, unspecified: Secondary | ICD-10-CM

## 2018-06-06 MED ORDER — ATORVASTATIN CALCIUM 10 MG PO TABS: 10.0000 mg | ORAL_TABLET | Freq: Every day | ORAL | 3 refills | Status: DC

## 2018-06-06 NOTE — Progress Notes (Signed)
Cardiology Office Note    Date:  06/07/2018   ID:  Christine Sandoval, DOB 1940/09/04, MRN [id number]  PCP:  Claretta Fraise, MD  Cardiologist:   Sanda Klein, MD   Chief Complaint  Patient presents with  . Hyperlipidemia    History of Present Illness:  Christine Sandoval is a 77 y.o. female with 2 previous episodes of non-ST segment elevation myocardial infarction in the absence of significant coronary artery disease, presumed to be secondary to vasospasm. The first episode occurred in the 1980s when she was smoking, the second occurred in 2010 when she had already quit smoking. Coronary angiography showed no evidence of any obstructive lesion. She has not had angina pectoris in the last 8 years. She pays a lot of attention to a healthy diet and healthy lifestyle.   In the past she took statins, but discontinued them since she felt it caused severe restless leg syndrome which prevented sleep.  Took red yeast rice supplements, but her lipid profile a year ago was still out of desirable range with an LDL cholesterol of 170.  We tried to start rosuvastatin once weekly, but she developed stinging neuropathy in her legs and discontinued it.  The symptoms then resolved.  Transient problems with hypokalemia have resolved.  Her most recent thyroid tests were all normal.  Feels great.  She is physically active.  She jogs in place for 45 minutes a day every day except for Sundays when she only exercises for 30 minutes.  The patient specifically denies any chest pain at rest exertion, dyspnea at rest or with exertion, orthopnea, paroxysmal nocturnal dyspnea, syncope, palpitations, focal neurological deficits, intermittent claudication, lower extremity edema, unexplained weight gain, cough, hemoptysis or wheezing.  Her most recent lipid profile from February 26, 2018 for the total of 256, triglycerides 186, HDL 69, LDL cholesterol 160.  Past Medical History:  Diagnosis Date  . CAD (coronary artery disease)     . COPD (chronic obstructive pulmonary disease) ([place])   . History of coronary vasospasm   . Hyperlipidemia   . Mitral valve prolapse    mild  . OAB (overactive bladder)   . Osteoporosis   . STEMI (ST elevation myocardial infarction) ([place]) 2011   at age 67 x 2 from coronary vasospasm; x 1 (2011)  . Venous insufficiency     Past Surgical History:  Procedure Laterality Date  . ABLATION SAPHENOUS VEIN W/ RFA  07/05/2010   left  . BREAST LUMPECTOMY     benign  . CARDIAC CATHETERIZATION  03/07/2009   Normal coronaries  . CATARACT EXTRACTION     bilateral eyes  . COLONOSCOPY     history of polyps  . DILATION AND CURETTAGE OF UTERUS      X 2  . MELANOMA EXCISION    . NM MYOCAR PERF WALL MOTION  05/23/2008   No significant ischemia  . US ECHOCARDIOGRAPHY  05/23/2008   MVP,mild mitral annular ca+,mild MR,AOV mildlly sclerotic    Current Medications: Outpatient Medications Prior to Visit  Medication Sig Dispense Refill  . Arginine 500 MG CAPS Take 2 capsules by mouth daily. L-Arginine    . Ascorbic Acid (VITAMIN C) 1000 MG tablet Take 1,000 mg by mouth 2 (two) times daily. Take 2 tablet a day    . aspirin 81 MG tablet Take 81 mg by mouth daily.    . B Complex Vitamins (B-COMPLEX/B-12 PO) Take 1 capsule by mouth daily.      . Calcium Citrate-Vitamin D (CALCIUM  CITRATE + D PO) Take 500 mg by mouth.    . Cholecalciferol (VITAMIN D) 1000 UNITS capsule Take 2,000 Units by mouth daily.     . Coenzyme Q10 (CO Q 10) 100 MG CAPS Take 1 capsule by mouth daily.    . fish oil-omega-3 fatty acids 1000 MG capsule Take 1 g by mouth daily.     Nyoka Cowden Tea, Camillia sinensis, ([employer] SUPER GREEN TEA EXTRACT) 250 MG CAPS Take 1 capsule by mouth 2 times daily at 12 noon and 4 pm. Takes 350 egcg bid    . L-Lysine 500 MG CAPS Take 1 capsule by mouth daily.    Marland Kitchen levothyroxine (SYNTHROID, LEVOTHROID) 75 MCG tablet Take 1 tablet (75 mcg total) by mouth daily. 30 tablet 2  . Multiple Vitamin (MULTIVITAMIN)  capsule Take 1 capsule by mouth daily.      . nitroGLYCERIN (NITROSTAT) 0.4 MG SL tablet Place 1 tablet (0.4 mg total) under the tongue every 5 (five) minutes as needed for chest pain. 25 tablet 3   Facility-Administered Medications Prior to Visit  Medication Dose Route Frequency Provider Last Rate Last Dose  . 0.9 %  sodium chloride infusion  500 mL Intravenous Continuous Irene Shipper, MD         Allergies:   Bactrim [sulfamethoxazole-trimethoprim]; Codeine; and Hydrochlorothiazide   Social History   Socioeconomic History  . Marital status: Married    Spouse name: Not on file  . Number of children: Not on file  . Years of education: Not on file  . Highest education level: Not on file  Occupational History  . Not on file  Social Needs  . Financial resource strain: Not on file  . Food insecurity:    Worry: Not on file    Inability: Not on file  . Transportation needs:    Medical: Not on file    Non-medical: Not on file  Tobacco Use  . Smoking status: Former Smoker    Types: Cigarettes    Last attempt to quit: 08/03/1988    Years since quitting: 29.8  . Smokeless tobacco: Never Used  Substance and Sexual Activity  . Alcohol use: No  . Drug use: No  . Sexual activity: Not on file  Lifestyle  . Physical activity:    Days per week: Not on file    Minutes per session: Not on file  . Stress: Not on file  Relationships  . Social connections:    Talks on phone: Not on file    Gets together: Not on file    Attends religious service: Not on file    Active member of club or organization: Not on file    Attends meetings of clubs or organizations: Not on file    Relationship status: Not on file  Other Topics Concern  . Not on file  Social History Narrative  . Not on file     Family History:  The patient's family history includes Alcohol abuse in her mother; Colon cancer in her maternal uncle; Colon cancer (age of onset: 19) in her maternal aunt; Diabetes in her son; Healthy  in her son and son; Heart disease (age of onset: 65) in her sister; Heart failure in her father; Neuropathy in her son.   ROS:   Please see the history of present illness.    All other systems are reviewed and are negative   PHYSICAL EXAM:   VS:  BP 124/82   Pulse 70   Ht 5'  3.5" (1.613 m)   Wt 116 lb (52.6 kg)   BMI 20.23 kg/m      General: Alert, oriented x3, no distress, very lean Head: no evidence of trauma, PERRL, EOMI, no exophtalmos or lid lag, no myxedema, no xanthelasma; normal ears, nose and oropharynx Neck: normal jugular venous pulsations and no hepatojugular reflux; brisk carotid pulses without delay and no carotid bruits Chest: clear to auscultation, no signs of consolidation by percussion or palpation, normal fremitus, symmetrical and full respiratory excursions Cardiovascular: normal position and quality of the apical impulse, regular rhythm, normal first and second heart sounds, no murmurs, rubs or gallops Abdomen: no tenderness or distention, no masses by palpation, no abnormal pulsatility or arterial bruits, normal bowel sounds, no hepatosplenomegaly Extremities: no clubbing, cyanosis or edema; 2+ radial, ulnar and brachial pulses bilaterally; 2+ right femoral, posterior tibial and dorsalis pedis pulses; 2+ left femoral, posterior tibial and dorsalis pedis pulses; no subclavian or femoral bruits Neurological: grossly nonfocal Psych: Normal mood and affect    Wt Readings from Last 3 Encounters:  06/06/18 116 lb (52.6 kg)  03/10/18 118 lb 4 oz (53.6 kg)  02/25/18 119 lb (54 kg)      Studies/Labs Reviewed:   EKG:  EKG is ordered today.  It is a normal tracing, sinus rhythm, QTC 4 seconds Recent Labs: 02/26/2018: ALT 18; BUN 11; Creatinine, Ser 0.83; Potassium 5.6; Sodium 143; TSH 1.040   Lipid Panel    Component Value Date/Time   CHOL 266 (H) 02/26/2018 1152   CHOL 171 02/21/2013 1031   TRIG 186 (H) 02/26/2018 1152   TRIG 197 (H) 11/28/2013 0803   TRIG  148 02/21/2013 1031   HDL 69 02/26/2018 1152   HDL 74 11/28/2013 0803   HDL 56 02/21/2013 1031   CHOLHDL 3.9 02/26/2018 1152   CHOLHDL 2.0 03/07/2009 1300   VLDL 19 03/07/2009 1300   LDLCALC 160 (H) 02/26/2018 1152   LDLCALC 157 (H) 11/28/2013 0803   LDLCALC 85 02/21/2013 1031     ASSESSMENT:    1. Coronary artery disease involving native coronary artery of native heart without angina pectoris   2. Dyslipidemia      PLAN:  In order of problems listed above:  1. CAD: No significant atherosclerotic lesions were seen at her last cardiac catheterization 2010 and is presumed that her previous myocardial infarction has happened due to vasospasm. She quit smoking many years ago.  She is active and is very lean.  She is an extremely healthy diet.  Despite this her LDL cholesterol is severely elevated.  This remains her only unaddressed coronary risk factor. 2. 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  I recommended that she start treatment with Repatha or Praluent but she is discouraged by the idea of self-administered injections and she would rather try to take statins again.  She will require a fairly potent agent such as rosuvastatin.  We will start this on an intermittent basis, just twice weekly and make sure that she is also taking a high dose of coenzyme Q 10.  If tolerated, recommend gently increasing the dose to be achieve an LDL cholesterol less than 100.  If not tolerated, we need to revisit the issue of treatment with a PCSK9 inhibitor.    Medication Adjustments/Labs and Tests Ordered: Current medicines are reviewed at length with the patient today.  Concerns regarding medicines are outlined above.  Medication changes, Labs and Tests ordered today are listed in the Patient Instructions below. Patient Instructions  Medication Instructions:  Dr Sallyanne Kuster has recommended making the following medication changes: 1. START Co Q 10 - take 300 mg daily 2.  START Atorvastatin 10 mg - take 1 tablet daily 3. DECREASE Aspirin to 81 mg daily  If you need a refill on your cardiac medications before your next appointment, please call your pharmacy.   Lab work: Your physician recommends that you return for lab work in 3 months - FASTING.  If you have labs (blood work) drawn today and your tests are completely normal, you will receive your results only by: Marland Kitchen MyChart Message (if you have MyChart) OR . A paper copy in the mail If you have any lab test that is abnormal or we need to change your treatment, we will call you to review the results.  Follow-Up: At [facility], you and your health needs are our priority.  As part of our continuing mission to provide you with exceptional heart care, we have created designated Provider Care Teams.  These Care Teams include your primary Cardiologist (physician) and Advanced Practice Providers (APPs -  Physician Assistants and Nurse Practitioners) who all work together to provide you with the care you need, when you need it. You will need a follow up appointment in 12 months.  Please call our office 2 months in advance to schedule this appointment.  You may see Sanda Klein, MD or one of the following Advanced Practice Providers on your designated Care Team: [place], [place] . Fabian Sharp, PA-C    Signed, Sanda Klein, MD  06/07/2018 8:36 AM    [facility] [place], [place], [place]  [address] Phone: [phone]; Fax: [phone]

## 2018-06-06 NOTE — Patient Instructions (Signed)
Medication Instructions:  Dr Sallyanne Kuster has recommended making the following medication changes: 1. START Co Q 10 - take 300 mg daily 2. START Atorvastatin 10 mg - take 1 tablet daily 3. DECREASE Aspirin to 81 mg daily  If you need a refill on your cardiac medications before your next appointment, please call your pharmacy.   Lab work: Your physician recommends that you return for lab work in 3 months - FASTING.  If you have labs (blood work) drawn today and your tests are completely normal, you will receive your results only by: Marland Kitchen MyChart Message (if you have MyChart) OR . A paper copy in the mail If you have any lab test that is abnormal or we need to change your treatment, we will call you to review the results.  Follow-Up: At [facility], you and your health needs are our priority.  As part of our continuing mission to provide you with exceptional heart care, we have created designated Provider Care Teams.  These Care Teams include your primary Cardiologist (physician) and Advanced Practice Providers (APPs -  Physician Assistants and Nurse Practitioners) who all work together to provide you with the care you need, when you need it. You will need a follow up appointment in 12 months.  Please call our office 2 months in advance to schedule this appointment.  You may see Sanda Klein, MD or one of the following Advanced Practice Providers on your designated Care Team: [place], [place] . Fabian Sharp, PA-C

## 2018-07-04 ENCOUNTER — Ambulatory Visit (INDEPENDENT_AMBULATORY_CARE_PROVIDER_SITE_OTHER): Payer: Medicare Other | Admitting: Family Medicine

## 2018-07-04 ENCOUNTER — Encounter: Payer: Self-pay | Admitting: Family Medicine

## 2018-07-04 VITALS — BP 122/62 | HR 70 | Temp 97.1°F | Ht 63.5 in | Wt 116.0 lb

## 2018-07-04 DIAGNOSIS — M792 Neuralgia and neuritis, unspecified: Secondary | ICD-10-CM | POA: Diagnosis not present

## 2018-07-04 DIAGNOSIS — H9202 Otalgia, left ear: Secondary | ICD-10-CM

## 2018-07-04 MED ORDER — GABAPENTIN 100 MG PO CAPS: 100.0000 mg | ORAL_CAPSULE | Freq: Two times a day (BID) | ORAL | 0 refills | Status: DC

## 2018-07-04 NOTE — Progress Notes (Signed)
Subjective: CC: left ear pain PCP: Claretta Fraise, MD HPI:Christine Sandoval is a 77 y.o. female presenting to clinic today for:  1. Left ear pain Patient reports onset of left-sided ear pain and pain at the base of her skull on the left side that radiates to the shoulder a few days ago.  She notes that the symptoms are pulsatile and electric-like.  Symptoms do seem worse with certain positions.  She does not recall lifting anything heavy besides Thanksgiving [place].  No preceding injury.  No ear discharge or bleeding.  No changes in hearing.  She has had some dizziness with fast movements like throwing her head back to take her pills in the morning.  She used Motrin which did not help but Tylenol did help relieve symptoms.  It was simply not long-lasting.  She notes that about 2 weeks ago she did feel like she had some throat swelling but that has since resolved.   ROS: Per HPI  Allergies  Allergen Reactions  . Bactrim [Sulfamethoxazole-Trimethoprim] Rash  . Codeine Other (See Comments)    PT FEELS CRAZY ON IT  . Hydrochlorothiazide Other (See Comments) and Cough   Past Medical History:  Diagnosis Date  . CAD (coronary artery disease)   . COPD (chronic obstructive pulmonary disease) ([place])   . History of coronary vasospasm   . Hyperlipidemia   . Mitral valve prolapse    mild  . OAB (overactive bladder)   . Osteoporosis   . STEMI (ST elevation myocardial infarction) ([place]) 2011   at age 64 x 2 from coronary vasospasm; x 1 (2011)  . Venous insufficiency     Current Outpatient Medications:  .  Arginine 500 MG CAPS, Take 2 capsules by mouth daily. L-Arginine, Disp: , Rfl:  .  Ascorbic Acid (VITAMIN C) 1000 MG tablet, Take 1,000 mg by mouth 2 (two) times daily. Take 2 tablet a day, Disp: , Rfl:  .  aspirin 81 MG tablet, Take 81 mg by mouth daily., Disp: , Rfl:  .  atorvastatin (LIPITOR) 10 MG tablet, Take 1 tablet (10 mg total) by mouth daily., Disp: 90 tablet, Rfl: 3 .  B Complex  Vitamins (B-COMPLEX/B-12 PO), Take 1 capsule by mouth daily.  , Disp: , Rfl:  .  Calcium Citrate-Vitamin D (CALCIUM CITRATE + D PO), Take 500 mg by mouth., Disp: , Rfl:  .  Cholecalciferol (VITAMIN D) 1000 UNITS capsule, Take 2,000 Units by mouth daily. , Disp: , Rfl:  .  Coenzyme Q10 (CO Q 10) 100 MG CAPS, Take 1 capsule by mouth daily., Disp: , Rfl:  .  fish oil-omega-3 fatty acids 1000 MG capsule, Take 1 g by mouth daily. , Disp: , Rfl:  .  Green Tea, Camillia sinensis, ([employer] SUPER GREEN TEA EXTRACT) 250 MG CAPS, Take 1 capsule by mouth 2 times daily at 12 noon and 4 pm. Takes 350 egcg bid, Disp: , Rfl:  .  L-Lysine 500 MG CAPS, Take 1 capsule by mouth daily., Disp: , Rfl:  .  levothyroxine (SYNTHROID, LEVOTHROID) 75 MCG tablet, Take 1 tablet (75 mcg total) by mouth daily., Disp: 30 tablet, Rfl: 2 .  Multiple Vitamin (MULTIVITAMIN) capsule, Take 1 capsule by mouth daily.  , Disp: , Rfl:  .  nitroGLYCERIN (NITROSTAT) 0.4 MG SL tablet, Place 1 tablet (0.4 mg total) under the tongue every 5 (five) minutes as needed for chest pain., Disp: 25 tablet, Rfl: 3  Current Facility-Administered Medications:  .  0.9 %  sodium  chloride infusion, 500 mL, Intravenous, Continuous, Irene Shipper, MD Social History   Socioeconomic History  . Marital status: Married    Spouse name: Not on file  . Number of children: Not on file  . Years of education: Not on file  . Highest education level: Not on file  Occupational History  . Not on file  Social Needs  . Financial resource strain: Not on file  . Food insecurity:    Worry: Not on file    Inability: Not on file  . Transportation needs:    Medical: Not on file    Non-medical: Not on file  Tobacco Use  . Smoking status: Former Smoker    Types: Cigarettes    Last attempt to quit: 08/03/1988    Years since quitting: 29.9  . Smokeless tobacco: Never Used  Substance and Sexual Activity  . Alcohol use: No  . Drug use: No  . Sexual activity: Not on file    Lifestyle  . Physical activity:    Days per week: Not on file    Minutes per session: Not on file  . Stress: Not on file  Relationships  . Social connections:    Talks on phone: Not on file    Gets together: Not on file    Attends religious service: Not on file    Active member of club or organization: Not on file    Attends meetings of clubs or organizations: Not on file    Relationship status: Not on file  . Intimate partner violence:    Fear of current or ex partner: Not on file    Emotionally abused: Not on file    Physically abused: Not on file    Forced sexual activity: Not on file  Other Topics Concern  . Not on file  Social History Narrative  . Not on file   Family History  Problem Relation Age of Onset  . Colon cancer Maternal Aunt 75  . Alcohol abuse Mother   . Heart failure Father   . Colon cancer Maternal Uncle   . Heart disease Sister 42       CHF  . Healthy Son   . Healthy Son   . Diabetes Son   . Neuropathy Son     Objective: Office vital signs reviewed. BP 122/62   Pulse 70   Temp (!) 97.1 F (36.2 C) (Oral)   Ht 5' 3.5" (1.613 m)   Wt 116 lb (52.6 kg)   BMI 20.23 kg/m   Physical Examination:  General: Awake, alert, well nourished, well-appearing.  No acute distress HEENT: Bilateral tympanic membranes intact with normal light reflex.  No associated erythema.  No bulging.  No purulence behind the tympanic membrane.  No erythema noted in the external auditory canals.  No tragal tenderness to palpation. MSK: +TTP to the base of the occiput on the left side.  She has tenderness to palpation along the paraspinal muscles within the C-spine and some into the left trapezius.  No palpable abnormalities.  Assessment/ Plan: 77 y.o. female   1. Neuralgia I question occipital neuralgia given distribution of symptoms.  I have given her a trial of gabapentin 100 mg to take nightly.  May increase to twice daily if not overly sedating and she is tolerating  the medication.  We discussed that if symptoms worsen or do not improve, could consider referral.  Would consider pain medicine and rehabilitation for possible occipital nerve injections.  2. Otalgia of left  ear No evidence of infection.  Likely referred pain from the occiput.   No orders of the defined types were placed in this encounter.  Meds ordered this encounter  Medications  . gabapentin (NEURONTIN) 100 MG capsule    Sig: Take 1 capsule (100 mg total) by mouth 2 (two) times daily.    Dispense:  60 capsule    Refill:  [place], DO [place] [phone]

## 2018-07-04 NOTE — Patient Instructions (Signed)
I am treating you as nerve pain, as I do think that the origin of your pain is neuropathic.  I have placed you on gabapentin 100 mg.  Start with taking 1 capsule every night at bedtime.  After 1 week, if it seems to be helping, you may increase to 1 in the morning and 1 in the evening.  We discussed that the medication may cause sedation.  Do not drive while taking the medicine.  If your symptoms are not improving or they worsen, please contact the office and we will place a referral to the specialist.  Occipital Neuralgia Occipital neuralgia is a type of headache that causes episodes of very bad pain in the back of your head. Pain from occipital neuralgia may spread (radiate) to other parts of your head. The pain is usually brief and often goes away after you rest and relax. These headaches may be caused by irritation of the nerves that leave your spinal cord high up in your neck, just below the base of your skull (occipital nerves). Your occipital nerves transmit sensations from the back of your head, the top of your head, and the areas behind your ears. What are the causes? Occipital neuralgia can occur without any known cause (primary headache syndrome). In other cases, occipital neuralgia is caused by pressure on or irritation of one of the two occipital nerves. Causes of occipital nerve compression or irritation include:  Wear and tear of the vertebrae in the neck (osteoarthritis).  Neck injury.  Disease of the disks that separate the vertebrae.  Tumors.  Gout.  Infections.  Diabetes.  Swollen blood vessels that put pressure on the occipital nerves.  Muscle spasm in the neck.  What are the signs or symptoms? Pain is the main symptom of occipital neuralgia. It usually starts in the back of the head but may also be felt in other areas supplied by the occipital nerves. Pain is usually on one side but may be on both sides. You may have:  Brief episodes of very bad pain that is burning,  stabbing, shocking, or shooting.  Pain behind the eye.  Pain triggered by neck movement or hair brushing.  Scalp tenderness.  Aching in the back of the head between episodes of very bad pain.  How is this diagnosed? Your health care provider may diagnose occipital neuralgia based on your symptoms and a physical exam. During the exam, the health care provider may push on areas supplied by the occipital nerves to see if they are painful. Some tests may also be done to help in making the diagnosis. These may include:  Imaging studies of the upper spinal cord, such as an MRI or CT scan. These may show compression or spinal cord abnormalities.  Nerve block. You will get an injection of numbing medicine (local anesthetic) near the occipital nerve to see if this relieves pain.  How is this treated? Treatment may begin with simple measures, such as:  Rest.  Massage.  Heat.  Over-the-counter pain relievers.  If these measures do not work, you may need other treatments, including:  Medicines such as: ? Prescription-strength anti-inflammatory medicines. ? Muscle relaxants. ? Antiseizure medicines. ? Antidepressants.  Steroid injection. This involves injections of local anesthetic and strong anti-inflammatory drugs (steroids).  Pulsed radiofrequency. Wires are implanted to deliver electrical impulses that block pain signals from the occipital nerve.  Physical therapy.  Surgery to relieve nerve pressure.  Follow these instructions at home:  Take all medicines as directed by  your health care provider.  Avoid activities that cause pain.  Rest when you have an attack of pain.  Try gentle massage or a heating pad to relieve pain.  Work with a physical therapist to learn stretching exercises you can do at home.  Try a different pillow or sleeping position.  Practice good posture.  Try to stay active. Get regular exercise that does not cause pain. Ask your health care  provider to suggest safe exercises for you.  Keep all follow-up visits as directed by your health care provider. This is important. Contact a health care provider if:  Your medicine is not working.  You have new or worsening symptoms. Get help right away if:  You have very bad head pain that is not going away.  You have a sudden change in vision, balance, or speech. This information is not intended to replace advice given to you by your health care provider. Make sure you discuss any questions you have with your health care provider. Document Released: 07/14/2001 Document Revised: 12/26/2015 Document Reviewed: 07/12/2013 Elsevier Interactive Patient Education  2017 [employer].

## 2018-09-02 ENCOUNTER — Ambulatory Visit: Payer: Medicare Other | Admitting: Family Medicine

## 2018-09-06 ENCOUNTER — Ambulatory Visit (INDEPENDENT_AMBULATORY_CARE_PROVIDER_SITE_OTHER): Payer: Medicare Other | Admitting: Family Medicine

## 2018-09-06 ENCOUNTER — Encounter: Payer: Self-pay | Admitting: Family Medicine

## 2018-09-06 VITALS — BP 122/80 | HR 72 | Temp 97.4°F | Ht 63.0 in | Wt 116.0 lb

## 2018-09-06 DIAGNOSIS — D1724 Benign lipomatous neoplasm of skin and subcutaneous tissue of left leg: Secondary | ICD-10-CM | POA: Diagnosis not present

## 2018-09-06 DIAGNOSIS — E559 Vitamin D deficiency, unspecified: Secondary | ICD-10-CM

## 2018-09-06 DIAGNOSIS — D171 Benign lipomatous neoplasm of skin and subcutaneous tissue of trunk: Secondary | ICD-10-CM

## 2018-09-06 DIAGNOSIS — M810 Age-related osteoporosis without current pathological fracture: Secondary | ICD-10-CM | POA: Diagnosis not present

## 2018-09-06 DIAGNOSIS — N3281 Overactive bladder: Secondary | ICD-10-CM | POA: Diagnosis not present

## 2018-09-06 DIAGNOSIS — I2510 Atherosclerotic heart disease of native coronary artery without angina pectoris: Secondary | ICD-10-CM | POA: Diagnosis not present

## 2018-09-06 DIAGNOSIS — L659 Nonscarring hair loss, unspecified: Secondary | ICD-10-CM

## 2018-09-06 DIAGNOSIS — E039 Hypothyroidism, unspecified: Secondary | ICD-10-CM

## 2018-09-06 DIAGNOSIS — D1722 Benign lipomatous neoplasm of skin and subcutaneous tissue of left arm: Secondary | ICD-10-CM

## 2018-09-06 MED ORDER — MIRABEGRON ER 25 MG PO TB24: 25.0000 mg | ORAL_TABLET | Freq: Every day | ORAL | Status: DC

## 2018-09-06 NOTE — Patient Instructions (Addendum)
You had labs ordered today.  You will be contacted with the results of the labs once they are available, usually in the next 3 business days for routine lab work.   We discussed having your bone density repeated.  There are oral options for osteoporosis.  First line medications include the bisphosphonate class: Fosamax, Boniva, Actonel, etc.  Prolia is a good choice but this again is an injectable.

## 2018-09-06 NOTE — Progress Notes (Signed)
Subjective: CC: Follow-up thyroid, hair loss PCP: Janora Norlander, DO HPI:Christine Sandoval is a 78 y.o. female presenting to clinic today for:  1.  Hypothyroidism Patient reports hypothyroidism was insidious onset.  Denies any history of surgery or radiation to the neck.  She does report hair loss which has been an issue for her for some time now.  She reports compliance with Synthroid 75 mcg daily.  She does have intermittent issues with constipation but denies any diarrhea, unplanned weight loss or weight gain.  No difficulty swallowing.  No change in voice, tremor or difficulty with sleep.  Not on any biotin supplements.  2.  Osteoporosis Noted on previous bone density scan.  She notes that she was offered the injectable treatment for osteoporosis but was reluctant to start this and therefore has never been started on any medicines for osteoporosis.  She does report being physically active but does not per se do any specific weightbearing exercises.  She does feel like she has a well-balanced dilator and also includes over-the-counter calcium and vitamin D supplementation.  3.  Lipoma Patient reports history of lipoma on the left posterior arm that was previously resected.  She notes this was about 25 years ago and it since has recurred.  She is interested in having this taken off again as well as a lesion on the back and left thigh.  She does report some discomfort surrounding the lipoma on the left posterior arm and left thigh.  4.  Nocturia Patient reports urinary frequency throughout the day but especially has up to 6 episodes of nocturia overnight.  She has been previously diagnosed with overactive bladder but was intolerant to the medication that were prescribed many years ago.  She is never tried Myrbetriq but would be interested in this.  ROS: Per HPI  Allergies  Allergen Reactions  . Bactrim [Sulfamethoxazole-Trimethoprim] Rash  . Codeine Other (See Comments)    PT FEELS CRAZY  ON IT  . Hydrochlorothiazide Other (See Comments) and Cough   Past Medical History:  Diagnosis Date  . CAD (coronary artery disease)   . COPD (chronic obstructive pulmonary disease) ([place])   . History of coronary vasospasm   . Hyperlipidemia   . Mitral valve prolapse    mild  . OAB (overactive bladder)   . Osteoporosis   . STEMI (ST elevation myocardial infarction) ([place]) 2011   at age 29 x 2 from coronary vasospasm; x 1 (2011)  . Venous insufficiency     Current Outpatient Medications:  .  Arginine 500 MG CAPS, Take 2 capsules by mouth daily. L-Arginine, Disp: , Rfl:  .  Ascorbic Acid (VITAMIN C) 1000 MG tablet, Take 1,000 mg by mouth 2 (two) times daily. Take 2 tablet a day, Disp: , Rfl:  .  aspirin 81 MG tablet, Take 81 mg by mouth daily., Disp: , Rfl:  .  B Complex Vitamins (B-COMPLEX/B-12 PO), Take 1 capsule by mouth daily.  , Disp: , Rfl:  .  Calcium Citrate-Vitamin D (CALCIUM CITRATE + D PO), Take 500 mg by mouth., Disp: , Rfl:  .  Cholecalciferol (VITAMIN D) 1000 UNITS capsule, Take 2,000 Units by mouth daily. , Disp: , Rfl:  .  Coenzyme Q10 (CO Q 10) 100 MG CAPS, Take 1 capsule by mouth daily., Disp: , Rfl:  .  fish oil-omega-3 fatty acids 1000 MG capsule, Take 1 g by mouth daily. , Disp: , Rfl:  .  Green Tea, Camillia sinensis, ([employer]S SUPER GREEN TEA  EXTRACT) 250 MG CAPS, Take 1 capsule by mouth 2 times daily at 12 noon and 4 pm. Takes 350 egcg bid, Disp: , Rfl:  .  L-Lysine 500 MG CAPS, Take 1 capsule by mouth daily., Disp: , Rfl:  .  levothyroxine (SYNTHROID, LEVOTHROID) 75 MCG tablet, Take 1 tablet (75 mcg total) by mouth daily., Disp: 30 tablet, Rfl: 2 .  Multiple Vitamin (MULTIVITAMIN) capsule, Take 1 capsule by mouth daily.  , Disp: , Rfl:  .  nitroGLYCERIN (NITROSTAT) 0.4 MG SL tablet, Place 1 tablet (0.4 mg total) under the tongue every 5 (five) minutes as needed for chest pain., Disp: 25 tablet, Rfl: 3 .  atorvastatin (LIPITOR) 10 MG tablet, Take 1 tablet (10 mg total)  by mouth daily., Disp: 90 tablet, Rfl: 3 .  gabapentin (NEURONTIN) 100 MG capsule, Take 1 capsule (100 mg total) by mouth 2 (two) times daily. (Patient not taking: Reported on 09/06/2018), Disp: 60 capsule, Rfl: 0  Current Facility-Administered Medications:  .  0.9 %  sodium chloride infusion, 500 mL, Intravenous, Continuous, Irene Shipper, MD Social History   Socioeconomic History  . Marital status: Married    Spouse name: Not on file  . Number of children: Not on file  . Years of education: Not on file  . Highest education level: Not on file  Occupational History  . Not on file  Social Needs  . Financial resource strain: Not on file  . Food insecurity:    Worry: Not on file    Inability: Not on file  . Transportation needs:    Medical: Not on file    Non-medical: Not on file  Tobacco Use  . Smoking status: Former Smoker    Types: Cigarettes    Last attempt to quit: 08/03/1988    Years since quitting: 30.1  . Smokeless tobacco: Never Used  Substance and Sexual Activity  . Alcohol use: No  . Drug use: No  . Sexual activity: Not on file  Lifestyle  . Physical activity:    Days per week: Not on file    Minutes per session: Not on file  . Stress: Not on file  Relationships  . Social connections:    Talks on phone: Not on file    Gets together: Not on file    Attends religious service: Not on file    Active member of club or organization: Not on file    Attends meetings of clubs or organizations: Not on file    Relationship status: Not on file  . Intimate partner violence:    Fear of current or ex partner: Not on file    Emotionally abused: Not on file    Physically abused: Not on file    Forced sexual activity: Not on file  Other Topics Concern  . Not on file  Social History Narrative  . Not on file   Family History  Problem Relation Age of Onset  . Colon cancer Maternal Aunt 75  . Alcohol abuse Mother   . Heart failure Father   . Colon cancer Maternal Uncle   .  Heart disease Sister 59       CHF  . Healthy Son   . Healthy Son   . Diabetes Son   . Neuropathy Son     Objective: Office vital signs reviewed. BP 122/80   Pulse 72   Temp (!) 97.4 F (36.3 C) (Oral)   Ht '5\' 3"'  (1.6 m)   Wt 116 lb (  52.6 kg)   BMI 20.55 kg/m   Physical Examination:  General: Awake, alert, well nourished, No acute distress HEENT: Normal    Neck: No masses palpated. No lymphadenopathy; no goiter    Eyes: extraocular membranes intact, sclera white.  No exophthalmos Cardio: regular rate and rhythm, S1S2 heard, no murmurs appreciated Pulm: clear to auscultation bilaterally, no wheezes, rhonchi or rales; normal work of breathing on room air Extremities: warm, well perfused, No edema, cyanosis or clubbing; +2 pulses bilaterally MSK: normal gait and station Skin: dry; intact; no rashes; she has a golf ball sized lipoma appreciated on the left posterior upper arm over the triceps region. There is a well healed scar over this region as well. She has a similar size lesion along the anterior upper thigh.  A much smaller gumball sized lesion appreciated along the upper mid back over the spine.  Texture of all are fatty in nature and suspicious of lipoma.  Skin is normal temperature Neuro: no tremor.  Assessment/ Plan: 78 y.o. female   1. Acquired hypothyroidism Check thyroid panel.  We will plan to adjust thyroid medication if needed. - Thyroid Panel With TSH; Future  2. Hair loss Possibly related to thyroid versus vitamin D deficiency/biotin versus female female pattern baldness.  No evidence of alopecia areata.  3. Osteoporosis, postmenopausal Untreated.  Plan for repeat DEXA, check calcium and vitamin D levels.  Handout also provided.  Encouraged weightbearing exercise - CMP14+EGFR; Future - VITAMIN D 25 Hydroxy (Vit-D Deficiency, Fractures); Future - DG [facility] DEXA; Future  4. Vitamin D deficiency Currently on OTC supplementation - CMP14+EGFR; Future - VITAMIN D  25 Hydroxy (Vit-D Deficiency, Fractures); Future  5. Coronary artery disease involving native coronary artery of native heart without angina pectoris Check CMP.  She will bring in fasting lipid orders tomorrow. - CMP14+EGFR; Future  6. Lipoma of left upper extremity Referral placed to GEN surge. - Ambulatory referral to General Surgery  7. Lipoma of left thigh - Ambulatory referral to General Surgery  8. Lipoma of back - Ambulatory referral to General Surgery  9. Overactive bladder 14-day sample of Myrbetriq 25 mg provided.  Take 1 tablet daily.  She will contact me if symptoms seem to be improving on this medicine I will place formal prescription.   Orders Placed This Encounter  Procedures  . DG [facility] DEXA    Standing Status:   Future    Standing Expiration Date:   11/05/2019    Order Specific Question:   Reason for Exam (SYMPTOM  OR DIAGNOSIS REQUIRED)    Answer:   osteoporosis  . Thyroid Panel With TSH    Standing Status:   Future    Standing Expiration Date:   09/07/2019  . CMP14+EGFR    Standing Status:   Future    Standing Expiration Date:   09/07/2019  . VITAMIN D 25 Hydroxy (Vit-D Deficiency, Fractures)    Standing Status:   Future    Standing Expiration Date:   09/07/2019   Meds ordered this encounter  Medications  . mirabegron ER (MYRBETRIQ) 25 MG TB24 tablet    Sig: Take 1 tablet (25 mg total) by mouth daily.    Dispense:  14 tablet    Refill:  [place], DO [place] [phone]

## 2018-09-07 ENCOUNTER — Other Ambulatory Visit: Payer: Medicare Other

## 2018-09-07 DIAGNOSIS — M810 Age-related osteoporosis without current pathological fracture: Secondary | ICD-10-CM | POA: Diagnosis not present

## 2018-09-07 DIAGNOSIS — E785 Hyperlipidemia, unspecified: Secondary | ICD-10-CM | POA: Diagnosis not present

## 2018-09-07 DIAGNOSIS — I2510 Atherosclerotic heart disease of native coronary artery without angina pectoris: Secondary | ICD-10-CM | POA: Diagnosis not present

## 2018-09-07 DIAGNOSIS — E559 Vitamin D deficiency, unspecified: Secondary | ICD-10-CM

## 2018-09-07 DIAGNOSIS — E039 Hypothyroidism, unspecified: Secondary | ICD-10-CM

## 2018-09-08 ENCOUNTER — Telehealth: Payer: Self-pay | Admitting: Family Medicine

## 2018-09-08 LAB — CMP14+EGFR
ALT: 21 IU/L (ref 0–32)
AST: 28 IU/L (ref 0–40)
Albumin/Globulin Ratio: 2.4 — ABNORMAL HIGH (ref 1.2–2.2)
Albumin: 4.7 g/dL (ref 3.7–4.7)
Alkaline Phosphatase: 77 IU/L (ref 39–117)
BUN/Creatinine Ratio: 17 (ref 12–28)
BUN: 14 mg/dL (ref 8–27)
Bilirubin Total: 0.5 mg/dL (ref 0.0–1.2)
CO2: 23 mmol/L (ref 20–29)
Calcium: 9.6 mg/dL (ref 8.7–10.3)
Chloride: 102 mmol/L (ref 96–106)
Creatinine, Ser: 0.84 mg/dL (ref 0.57–1.00)
GFR calc Af Amer: 79 mL/min/{1.73_m2} (ref >59)
GFR calc non Af Amer: 68 mL/min/{1.73_m2} (ref >59)
Globulin, Total: 2 g/dL (ref 1.5–4.5)
Glucose: 97 mg/dL (ref 65–99)
Potassium: 4.6 mmol/L (ref 3.5–5.2)
SODIUM: 143 mmol/L (ref 134–144)
Total Protein: 6.7 g/dL (ref 6.0–8.5)

## 2018-09-08 LAB — THYROID PANEL WITH TSH
Free Thyroxine Index: 2.9 (ref 1.2–4.9)
T3 Uptake Ratio: 27 % (ref 24–39)
T4, Total: 10.9 ug/dL (ref 4.5–12.0)
TSH: 0.934 u[IU]/mL (ref 0.450–4.500)

## 2018-09-08 LAB — LIPID PANEL
CHOL/HDL RATIO: 2.4 ratio (ref 0.0–4.4)
Cholesterol, Total: 203 mg/dL — ABNORMAL HIGH (ref 100–199)
HDL: 84 mg/dL (ref >39)
LDL Calculated: 95 mg/dL (ref 0–99)
TRIGLYCERIDES: 121 mg/dL (ref 0–149)
VLDL CHOLESTEROL CAL: 24 mg/dL (ref 5–40)

## 2018-09-08 LAB — VITAMIN D 25 HYDROXY (VIT D DEFICIENCY, FRACTURES): VIT D 25 HYDROXY: 55.7 ng/mL (ref 30.0–100.0)

## 2018-09-08 NOTE — Telephone Encounter (Signed)
Aware of results. 

## 2018-09-08 NOTE — Telephone Encounter (Signed)
rtn call about lab results

## 2018-09-09 ENCOUNTER — Other Ambulatory Visit: Payer: Self-pay

## 2018-09-09 DIAGNOSIS — E7800 Pure hypercholesterolemia, unspecified: Secondary | ICD-10-CM

## 2018-09-09 DIAGNOSIS — I2510 Atherosclerotic heart disease of native coronary artery without angina pectoris: Secondary | ICD-10-CM

## 2018-09-09 MED ORDER — ROSUVASTATIN CALCIUM 10 MG PO TABS: 10.0000 mg | ORAL_TABLET | Freq: Every day | ORAL | 6 refills | Status: AC

## 2018-09-12 DIAGNOSIS — L821 Other seborrheic keratosis: Secondary | ICD-10-CM | POA: Diagnosis not present

## 2018-09-12 DIAGNOSIS — D225 Melanocytic nevi of trunk: Secondary | ICD-10-CM | POA: Diagnosis not present

## 2018-09-12 DIAGNOSIS — D485 Neoplasm of uncertain behavior of skin: Secondary | ICD-10-CM | POA: Diagnosis not present

## 2018-09-12 DIAGNOSIS — Z85828 Personal history of other malignant neoplasm of skin: Secondary | ICD-10-CM | POA: Diagnosis not present

## 2018-09-12 DIAGNOSIS — L570 Actinic keratosis: Secondary | ICD-10-CM | POA: Diagnosis not present

## 2018-09-19 ENCOUNTER — Ambulatory Visit (INDEPENDENT_AMBULATORY_CARE_PROVIDER_SITE_OTHER): Payer: Medicare Other

## 2018-09-19 DIAGNOSIS — M810 Age-related osteoporosis without current pathological fracture: Secondary | ICD-10-CM | POA: Diagnosis not present

## 2018-09-27 ENCOUNTER — Ambulatory Visit (INDEPENDENT_AMBULATORY_CARE_PROVIDER_SITE_OTHER): Payer: Medicare Other | Admitting: General Surgery

## 2018-09-27 ENCOUNTER — Encounter: Payer: Self-pay | Admitting: General Surgery

## 2018-09-27 VITALS — BP 130/76 | HR 73 | Temp 97.8°F | Resp 18 | Wt 117.8 lb

## 2018-09-27 DIAGNOSIS — D171 Benign lipomatous neoplasm of skin and subcutaneous tissue of trunk: Secondary | ICD-10-CM

## 2018-09-27 DIAGNOSIS — D1722 Benign lipomatous neoplasm of skin and subcutaneous tissue of left arm: Secondary | ICD-10-CM

## 2018-09-27 DIAGNOSIS — D1724 Benign lipomatous neoplasm of skin and subcutaneous tissue of left leg: Secondary | ICD-10-CM

## 2018-09-27 NOTE — Progress Notes (Signed)
[facility] History and Physical  Reason for Referral: Lipoma  Referring Physician:  Dr. Lajuana Ripple, MD     Christine Sandoval is a 77 y.o. female.  HPI: Christine Sandoval is a lovely 79 yo with a history of multiple lipomas that she would like to get removed due to discomfort. She reports that she had a prior lipoma removal on her left arm years ago in an office environment, and this has recurred.  She also has a lipoma on her back and on her left anterior thigh. She says that the left arm and back lipomas cause her discomfort at night and while sitting upright in a hard back chair. She denies ever having any swelling, drainage, redness, or signs that these were cysts.  She says that she is otherwise healthy, but did have a MI in the past which were thought to be secondary to vasospasm as her cardiac catheterization in 2011 should no occlusive disease.  She is active and exercises regularly and has no chest pain or SOB. She has a remote history of alcoholism and this was around the time of her prior MI in the 10s. The last possible MI was in 2011 at the time of the catheterization and this is when the question of vasospasm was presented.   Past Medical History:  Diagnosis Date  . CAD (coronary artery disease)   . COPD (chronic obstructive pulmonary disease) ([place])   . History of coronary vasospasm   . Hyperlipidemia   . Mitral valve prolapse    mild  . OAB (overactive bladder)   . Osteoporosis   . STEMI (ST elevation myocardial infarction) ([place]) 2011   at age 20 x 2 from coronary vasospasm; x 1 (2011)  . Venous insufficiency     Past Surgical History:  Procedure Laterality Date  . ABLATION SAPHENOUS VEIN W/ RFA  07/05/2010   left  . BREAST LUMPECTOMY     benign  . CARDIAC CATHETERIZATION  03/07/2009   Normal coronaries  . CATARACT EXTRACTION     bilateral eyes  . COLONOSCOPY     history of polyps  . DILATION AND CURETTAGE OF UTERUS      X 2  . MELANOMA EXCISION    . NM  MYOCAR PERF WALL MOTION  05/23/2008   No significant ischemia  . US ECHOCARDIOGRAPHY  05/23/2008   MVP,mild mitral annular ca+,mild MR,AOV mildlly sclerotic    Family History  Problem Relation Age of Onset  . Colon cancer Maternal Aunt 75  . Alcohol abuse Mother   . Heart failure Father   . Colon cancer Maternal Uncle   . Heart disease Sister 50       CHF  . Healthy Son   . Healthy Son   . Diabetes Son   . Neuropathy Son     Social History   Tobacco Use  . Smoking status: Former Smoker    Types: Cigarettes    Last attempt to quit: 08/03/1988    Years since quitting: 30.1  . Smokeless tobacco: Never Used  Substance Use Topics  . Alcohol use: No  . Drug use: No    Medications: I have reviewed the patient's current medications. Allergies as of 09/27/2018      Reactions   Bactrim [sulfamethoxazole-trimethoprim] Rash   Codeine Other (See Comments)   PT FEELS CRAZY ON IT   Hydrochlorothiazide Other (See Comments), Cough      Medication List       Accurate as of September 27, 2018 12:09 PM. Always use your most recent med list.        Arginine 500 MG Caps Take 2 capsules by mouth daily. L-Arginine   aspirin 81 MG tablet Take 81 mg by mouth daily.   B-COMPLEX/B-12 PO Take 1 capsule by mouth daily.   CALCIUM CITRATE + D PO Take 500 mg by mouth.   Co Q 10 100 MG Caps Take 1 capsule by mouth daily.   [employer]S SUPER GREEN TEA EXTRACT 250 MG Caps Generic drug:  Green Tea (Camellia sinensis) Take 1 capsule by mouth 2 times daily at 12 noon and 4 pm. Takes 350 egcg bid   fish oil-omega-3 fatty acids 1000 MG capsule Take 1 g by mouth daily.   L-Lysine 500 MG Caps Take 1 capsule by mouth daily.   levothyroxine 75 MCG tablet Commonly known as:  SYNTHROID, LEVOTHROID Take 1 tablet (75 mcg total) by mouth daily.   multivitamin capsule Take 1 capsule by mouth daily.   nitroGLYCERIN 0.4 MG SL tablet Commonly known as:  NITROSTAT Place 1 tablet (0.4 mg total) under  the tongue every 5 (five) minutes as needed for chest pain.   rosuvastatin 10 MG tablet Commonly known as:  CRESTOR Take 1 tablet (10 mg total) by mouth daily.   vitamin C 1000 MG tablet Take 1,000 mg by mouth 2 (two) times daily. Take 2 tablet a day   Vitamin D 1000 units capsule Take 2,000 Units by mouth daily.        ROS:  A comprehensive review of systems was negative except for: Musculoskeletal: positive for pain at the sites of the left arm, back, and left thigh lipoma  Blood pressure 130/76, pulse 73, temperature 97.8 F (36.6 C), temperature source Temporal, resp. rate 18, weight 117 lb 12.8 oz (53.4 kg). Physical Exam Vitals signs reviewed.  Constitutional:      Appearance: Normal appearance.  HENT:     Head: Normocephalic and atraumatic.     Nose: Nose normal.     Mouth/Throat:     Mouth: Mucous membranes are moist.  Eyes:     Extraocular Movements: Extraocular movements intact.     Pupils: Pupils are equal, round, and reactive to light.  Neck:     Musculoskeletal: Normal range of motion.  Cardiovascular:     Rate and Rhythm: Normal rate and regular rhythm.  Pulmonary:     Effort: Pulmonary effort is normal.     Breath sounds: Normal breath sounds.  Abdominal:     General: There is no distension.     Palpations: Abdomen is soft.     Tenderness: There is no abdominal tenderness.  Musculoskeletal: Normal range of motion.        General: No swelling.     Comments: Back, left of midline, 2cm superficial, mobile mass; left posterior arm, prior incision noted, mobile and soft 6cm mass, consistent with lipoma; left anterior thigh, 2cm mobile area, feels relatively superficial; [place] of these areas at the bedside demonstrated discrete globes of homogenous fatty tissue, consistent with lipomas   Skin:    General: Skin is warm and dry.  Neurological:     General: No focal deficit present.     Mental Status: She is alert and oriented to person, place, and time.    Psychiatric:        Mood and Affect: Mood normal.        Behavior: Behavior normal.        Thought Content:  Thought content normal.        Judgment: Judgment normal.    Results: None   Assessment & Plan:  Christine Sandoval is a 78 y.o. female with multiple lipomas that are causing her discomfort. She would like to have these removed. We have discussed the options and she wants to pursue local anesthetic only.  We discussed the risk of removal including but not limited to bleeding, infection, recurrence, and the area being deeper than anticipate.  - Procedure room for excision of left arm, left anterior thigh and back lipomas   All questions were answered to the satisfaction of the patient.   Virl Cagey 09/27/2018, 12:09 PM

## 2018-09-27 NOTE — Patient Instructions (Signed)
Lipoma Removal Lipoma removal is a surgical procedure to remove a noncancerous (benign) tumor that is made up of fat cells (lipoma). Most lipomas are small and painless and do not require treatment. They can form in many areas of the body but are most common under the skin of the back, shoulders, arms, and thighs. You may need lipoma removal if you have a lipoma that is large, growing, or causing discomfort. Lipoma removal may also be done for cosmetic reasons. Tell a health care provider about:  Any allergies you have.  All medicines you are taking, including vitamins, herbs, eye drops, creams, and over-the-counter medicines.  Any problems you or family members have had with anesthetic medicines.  Any blood disorders you have.  Any surgeries you have had.  Any medical conditions you have.  Whether you are pregnant or may be pregnant. What are the risks? Generally, this is a safe procedure. However, problems may occur, including:  Infection.  Bleeding.  Allergic reactions to medicines.  Damage to nerves or blood vessels near the lipoma.  Scarring. What happens before the procedure? Staying hydrated Follow instructions from your health care provider about hydration, which may include:  Up to 2 hours before the procedure - you may continue to drink clear liquids, such as water, clear fruit juice, black coffee, and plain tea. Eating and drinking restrictions Follow instructions from your health care provider about eating and drinking, which may include:  8 hours before the procedure - stop eating heavy meals or foods such as meat, fried foods, or fatty foods.  6 hours before the procedure - stop eating light meals or foods, such as toast or cereal.  6 hours before the procedure - stop drinking milk or drinks that contain milk.  2 hours before the procedure - stop drinking clear liquids. Medicines  Ask your health care provider about: ? Changing or stopping your regular  medicines. This is especially important if you are taking diabetes medicines or blood thinners. ? Taking medicines such as aspirin and ibuprofen. These medicines can thin your blood. Do not take these medicines before your procedure if your health care provider instructs you not to.  You may be given antibiotic medicine to help prevent infection. General instructions  Ask your health care provider how your surgical site will be marked or identified.  You will have a physical exam. Your health care provider will check the size of the lipoma and whether it can be moved easily.  You may have imaging tests, such as: ? X-rays. ? CT scan. ? MRI.  Plan to have someone take you home from the hospital or clinic. What happens during the procedure?  To reduce your risk of infection: ? Your health care team will wash or sanitize their hands. ? Your skin will be washed with soap.  You will be given one or more of the following: ? A medicine to help you relax (sedative). ? A medicine to numb the area (local anesthetic). ? A medicine to make you fall asleep (general anesthetic). ? A medicine that is injected into an area of your body to numb everything below the injection site (regional anesthetic).  An incision will be made over the lipoma or very near the lipoma. The incision may be made in a natural skin line or crease.  Tissues, nerves, and blood vessels near the lipoma will be moved out of the way.  The lipoma and the capsule that surrounds it will be separated from the surrounding  tissues.  The lipoma will be removed.  The incision may be closed with stitches (sutures).  A bandage (dressing) will be placed over the incision. What happens after the procedure?  Do not drive for 24 hours if you received a sedative.  Your blood pressure, heart rate, breathing rate, and blood oxygen level will be monitored until the medicines you were given have worn off. This information is not  intended to replace advice given to you by your health care provider. Make sure you discuss any questions you have with your health care provider. Document Released: 10/03/2015 Document Revised: 12/26/2015 Document Reviewed: 10/03/2015 Elsevier Interactive Patient Education  2019 [employer]  Lipoma  A lipoma is a noncancerous (benign) tumor that is made up of fat cells. This is a very common type of soft-tissue growth. Lipomas are usually found under the skin (subcutaneous). They may occur in any tissue of the body that contains fat. Common areas for lipomas to appear include the back, shoulders, buttocks, and thighs.  Lipomas grow slowly, and they are usually painless. Most lipomas do not cause problems and do not require treatment. What are the causes? The cause of this condition is not known. What increases the risk? You are more likely to develop this condition if:  You are 76-34 years old.  You have a family history of lipomas. What are the signs or symptoms? A lipoma usually appears as a small, round bump under the skin. In most cases, the lump will:  Feel soft or rubbery.  Not cause pain or other symptoms. However, if a lipoma is located in an area where it pushes on nerves, it can become painful or cause other symptoms. How is this diagnosed? A lipoma can usually be diagnosed with a physical exam. You may also have tests to confirm the diagnosis and to rule out other conditions. Tests may include:  Imaging tests, such as a CT scan or MRI.  Removal of a tissue sample to be looked at under a microscope (biopsy). How is this treated? Treatment for this condition depends on the size of the lipoma and whether it is causing any symptoms.  For small lipomas that are not causing problems, no treatment is needed.  If a lipoma is bigger or it causes problems, surgery may be done to remove the lipoma. Lipomas can also be removed to improve appearance. Most often, the procedure is  done after applying a medicine that numbs the area (local anesthetic). Follow these instructions at home:  Watch your lipoma for any changes.  Keep all follow-up visits as told by your health care provider. This is important. Contact a health care provider if:  Your lipoma becomes larger or hard.  Your lipoma becomes painful, red, or increasingly swollen. These could be signs of infection or a more serious condition. Get help right away if:  You develop tingling or numbness in an area near the lipoma. This could indicate that the lipoma is causing nerve damage. Summary  A lipoma is a noncancerous tumor that is made up of fat cells.  Most lipomas do not cause problems and do not require treatment.  If a lipoma is bigger or it causes problems, surgery may be done to remove the lipoma. This information is not intended to replace advice given to you by your health care provider. Make sure you discuss any questions you have with your health care provider. Document Released: 07/10/2002 Document Revised: 07/06/2017 Document Reviewed: 07/06/2017 Elsevier Interactive Patient Education  2019  [employer].

## 2018-10-03 NOTE — H&P (Signed)
[facility] History and Physical  Reason for Referral: Lipoma  Referring Physician:  Dr. Lajuana Ripple, MD    Christine Sandoval is a 78 y.o. female.  HPI: Christine Sandoval is a lovely 78 yo with a history of multiple lipomas that she would like to get removed due to discomfort. She reports that she had a prior lipoma removal on her left arm years ago in an office environment, and this has recurred.  She also has a lipoma on her back and on her left anterior thigh. She says that the left arm and back lipomas cause her discomfort at night and while sitting upright in a hard back chair. She denies ever having any swelling, drainage, redness, or signs that these were cysts.  She says that she is otherwise healthy, but did have a MI in the past which were thought to be secondary to vasospasm as her cardiac catheterization in 2011 should no occlusive disease.  She is active and exercises regularly and has no chest pain or SOB. She has a remote history of alcoholism and this was around the time of her prior MI in the 63s. The last possible MI was in 2011 at the time of the catheterization and this is when the question of vasospasm was presented.       Past Medical History:  Diagnosis Date  . CAD (coronary artery disease)   . COPD (chronic obstructive pulmonary disease) ([place])   . History of coronary vasospasm   . Hyperlipidemia   . Mitral valve prolapse    mild  . OAB (overactive bladder)   . Osteoporosis   . STEMI (ST elevation myocardial infarction) ([place]) 2011   at age 42 x 2 from coronary vasospasm; x 1 (2011)  . Venous insufficiency          Past Surgical History:  Procedure Laterality Date  . ABLATION SAPHENOUS VEIN W/ RFA  07/05/2010   left  . BREAST LUMPECTOMY     benign  . CARDIAC CATHETERIZATION  03/07/2009   Normal coronaries  . CATARACT EXTRACTION     bilateral eyes  . COLONOSCOPY     history of polyps  . DILATION AND CURETTAGE OF UTERUS       X 2  . MELANOMA EXCISION    . NM MYOCAR PERF WALL MOTION  05/23/2008   No significant ischemia  . US ECHOCARDIOGRAPHY  05/23/2008   MVP,mild mitral annular ca+,mild MR,AOV mildlly sclerotic         Family History  Problem Relation Age of Onset  . Colon cancer Maternal Aunt 75  . Alcohol abuse Mother   . Heart failure Father   . Colon cancer Maternal Uncle   . Heart disease Sister 60       CHF  . Healthy Son   . Healthy Son   . Diabetes Son   . Neuropathy Son     Social History        Tobacco Use  . Smoking status: Former Smoker    Types: Cigarettes    Last attempt to quit: 08/03/1988    Years since quitting: 30.1  . Smokeless tobacco: Never Used  Substance Use Topics  . Alcohol use: No  . Drug use: No    Medications: I have reviewed the patient's current medications.      Allergies as of 09/27/2018      Reactions   Bactrim [sulfamethoxazole-trimethoprim] Rash   Codeine Other (See Comments)   PT FEELS CRAZY ON IT  Hydrochlorothiazide Other (See Comments), Cough         Medication List       Accurate as of September 27, 2018 12:09 PM. Always use your most recent med list.        Arginine 500 MG Caps Take 2 capsules by mouth daily. L-Arginine   aspirin 81 MG tablet Take 81 mg by mouth daily.   B-COMPLEX/B-12 PO Take 1 capsule by mouth daily.   CALCIUM CITRATE + D PO Take 500 mg by mouth.   Co Q 10 100 MG Caps Take 1 capsule by mouth daily.   [employer] SUPER GREEN TEA EXTRACT 250 MG Caps Generic drug:  Green Tea (Camellia sinensis) Take 1 capsule by mouth 2 times daily at 12 noon and 4 pm. Takes 350 egcg bid   fish oil-omega-3 fatty acids 1000 MG capsule Take 1 g by mouth daily.   L-Lysine 500 MG Caps Take 1 capsule by mouth daily.   levothyroxine 75 MCG tablet Commonly known as:  SYNTHROID, LEVOTHROID Take 1 tablet (75 mcg total) by mouth daily.   multivitamin capsule Take 1 capsule by mouth  daily.   nitroGLYCERIN 0.4 MG SL tablet Commonly known as:  NITROSTAT Place 1 tablet (0.4 mg total) under the tongue every 5 (five) minutes as needed for chest pain.   rosuvastatin 10 MG tablet Commonly known as:  CRESTOR Take 1 tablet (10 mg total) by mouth daily.   vitamin C 1000 MG tablet Take 1,000 mg by mouth 2 (two) times daily. Take 2 tablet a day   Vitamin D 1000 units capsule Take 2,000 Units by mouth daily.        ROS:  A comprehensive review of systems was negative except for: Musculoskeletal: positive for pain at the sites of the left arm, back, and left thigh lipoma  Blood pressure 130/76, pulse 73, temperature 97.8 F (36.6 C), temperature source Temporal, resp. rate 18, weight 117 lb 12.8 oz (53.4 kg). Physical Exam Vitals signs reviewed.  Constitutional:      Appearance: Normal appearance.  HENT:     Head: Normocephalic and atraumatic.     Nose: Nose normal.     Mouth/Throat:     Mouth: Mucous membranes are moist.  Eyes:     Extraocular Movements: Extraocular movements intact.     Pupils: Pupils are equal, round, and reactive to light.  Neck:     Musculoskeletal: Normal range of motion.  Cardiovascular:     Rate and Rhythm: Normal rate and regular rhythm.  Pulmonary:     Effort: Pulmonary effort is normal.     Breath sounds: Normal breath sounds.  Abdominal:     General: There is no distension.     Palpations: Abdomen is soft.     Tenderness: There is no abdominal tenderness.  Musculoskeletal: Normal range of motion.        General: No swelling.     Comments: Back, left of midline, 2cm superficial, mobile mass; left posterior arm, prior incision noted, mobile and soft 6cm mass, consistent with lipoma; left anterior thigh, 2cm mobile area, feels relatively superficial; [place] of these areas at the bedside demonstrated discrete globes of homogenous fatty tissue, consistent with lipomas   Skin:    General: Skin is warm and dry.  Neurological:      General: No focal deficit present.     Mental Status: She is alert and oriented to person, place, and time.  Psychiatric:  Mood and Affect: Mood normal.        Behavior: Behavior normal.        Thought Content: Thought content normal.        Judgment: Judgment normal.    Results: None   Assessment & Plan:  Christine Sandoval is a 78 y.o. female with multiple lipomas that are causing her discomfort. She would like to have these removed. We have discussed the options and she wants to pursue local anesthetic only.  We discussed the risk of removal including but not limited to bleeding, infection, recurrence, and the area being deeper than anticipate.  - Procedure room for excision of left arm, left anterior thigh and back lipomas   All questions were answered to the satisfaction of the patient.   Virl Cagey 09/27/2018, 12:09 PM

## 2018-10-05 ENCOUNTER — Encounter: Payer: Self-pay | Admitting: Family Medicine

## 2018-10-05 ENCOUNTER — Ambulatory Visit (INDEPENDENT_AMBULATORY_CARE_PROVIDER_SITE_OTHER): Payer: Medicare Other | Admitting: Family Medicine

## 2018-10-05 VITALS — BP 120/78 | HR 66 | Temp 97.6°F | Ht 63.0 in | Wt 116.0 lb

## 2018-10-05 DIAGNOSIS — I8393 Asymptomatic varicose veins of bilateral lower extremities: Secondary | ICD-10-CM | POA: Insufficient documentation

## 2018-10-05 DIAGNOSIS — I2510 Atherosclerotic heart disease of native coronary artery without angina pectoris: Secondary | ICD-10-CM | POA: Diagnosis not present

## 2018-10-05 DIAGNOSIS — M810 Age-related osteoporosis without current pathological fracture: Secondary | ICD-10-CM

## 2018-10-05 NOTE — Progress Notes (Signed)
Subjective: CC: osteoporosis PCP: Janora Norlander, DO HPI:Christine Sandoval is a 78 y.o. female presenting to clinic today for:  1. Osteoporosis Patient with known osteoporosis.  She had repeat bone density scan which noted a further decline of her bone density to -3.5.  She is here today to discuss options.  Of note, medical history significant for MI x3.  No history of fracture.  She is currently on vitamin D and calcium supplementation with both her vitamin D and calcium within normal range.  She is physically active.  At last visit, we reviewed dietary sources of calcium and vitamin D as well as talked about weightbearing exercise.  No reflux symptoms.  No history of esophageal erosion or ulcerations.  2.  Varicose veins Patient reports history of varicose veins bilateral lower extremities.  She previously had radiofrequency treatments of the varicose veins with excellent results.  This was done with Dr. Elisabeth Cara but he has since moved out of the area.  She would like to see another provider if that is able to perform this procedure but has been unable to find one.  She is asking for assistance with this as she has enlarging and tortuous varicose veins in bilateral lower extremities.  ROS: Per HPI  Allergies  Allergen Reactions  . Bactrim [Sulfamethoxazole-Trimethoprim] Rash  . Codeine Other (See Comments)    PT FEELS CRAZY ON IT  . Hydrochlorothiazide Other (See Comments) and Cough   Past Medical History:  Diagnosis Date  . CAD (coronary artery disease)   . COPD (chronic obstructive pulmonary disease) ([place])   . History of coronary vasospasm   . Hyperlipidemia   . Mitral valve prolapse    mild  . OAB (overactive bladder)   . Osteoporosis   . STEMI (ST elevation myocardial infarction) ([place]) 2011   at age 39 x 2 from coronary vasospasm; x 1 (2011)  . Venous insufficiency     Current Outpatient Medications:  .  Arginine 500 MG CAPS, Take 1 capsule by mouth 3 (three) times  daily. L-Arginine , Disp: , Rfl:  .  Ascorbic Acid (VITAMIN C) 1000 MG tablet, Take 1,000 mg by mouth 3 (three) times daily. , Disp: , Rfl:  .  aspirin EC 81 MG tablet, Take 81 mg by mouth daily., Disp: , Rfl:  .  B Complex Vitamins (B-COMPLEX/B-12 PO), Take 1 capsule by mouth daily.  , Disp: , Rfl:  .  Calcium Citrate-Vitamin D (CALCIUM CITRATE + D PO), Take 1 tablet by mouth 3 (three) times daily. , Disp: , Rfl:  .  Cholecalciferol (VITAMIN D) 50 MCG (2000 UT) tablet, Take 2,000 Units by mouth daily. , Disp: , Rfl:  .  Coenzyme Q10 (CO Q 10 PO), Take 200 mg by mouth daily. , Disp: , Rfl:  .  fish oil-omega-3 fatty acids 1000 MG capsule, Take 1 g by mouth daily. , Disp: , Rfl:  .  Green Tea, Camillia sinensis, (CVS SUPER GREEN TEA EXTRACT) 250 MG CAPS, Take 1 capsule by mouth 2 times daily at 12 noon and 4 pm. Takes 350 egcg bid, Disp: , Rfl:  .  L-Lysine 500 MG CAPS, Take 1 capsule by mouth 2 (two) times daily. , Disp: , Rfl:  .  levothyroxine (SYNTHROID, LEVOTHROID) 50 MCG tablet, Take 50 mcg by mouth daily before breakfast., Disp: , Rfl:  .  magnesium oxide (MAG-OX) 400 MG tablet, Take 400 mg by mouth every evening., Disp: , Rfl:  .  Multiple Vitamin (MULTIVITAMIN)  capsule, Take 1 capsule by mouth daily.  , Disp: , Rfl:  .  nitroGLYCERIN (NITROSTAT) 0.4 MG SL tablet, Place 1 tablet (0.4 mg total) under the tongue every 5 (five) minutes as needed for chest pain., Disp: 25 tablet, Rfl: 3 .  rosuvastatin (CRESTOR) 10 MG tablet, Take 1 tablet (10 mg total) by mouth daily., Disp: 30 tablet, Rfl: 6 .  Zinc 30 MG TABS, Take 30 mg by mouth daily., Disp: , Rfl:   Current Facility-Administered Medications:  .  0.9 %  sodium chloride infusion, 500 mL, Intravenous, Continuous, Irene Shipper, MD Social History   Socioeconomic History  . Marital status: Married    Spouse name: Not on file  . Number of children: Not on file  . Years of education: Not on file  . Highest education level: Not on file    Occupational History  . Not on file  Social Needs  . Financial resource strain: Not on file  . Food insecurity:    Worry: Not on file    Inability: Not on file  . Transportation needs:    Medical: Not on file    Non-medical: Not on file  Tobacco Use  . Smoking status: Former Smoker    Types: Cigarettes    Last attempt to quit: 08/03/1988    Years since quitting: 30.1  . Smokeless tobacco: Never Used  Substance and Sexual Activity  . Alcohol use: No  . Drug use: No  . Sexual activity: Not on file  Lifestyle  . Physical activity:    Days per week: Not on file    Minutes per session: Not on file  . Stress: Not on file  Relationships  . Social connections:    Talks on phone: Not on file    Gets together: Not on file    Attends religious service: Not on file    Active member of club or organization: Not on file    Attends meetings of clubs or organizations: Not on file    Relationship status: Not on file  . Intimate partner violence:    Fear of current or ex partner: Not on file    Emotionally abused: Not on file    Physically abused: Not on file    Forced sexual activity: Not on file  Other Topics Concern  . Not on file  Social History Narrative  . Not on file   Family History  Problem Relation Age of Onset  . Colon cancer Maternal Aunt 75  . Alcohol abuse Mother   . Heart failure Father   . Colon cancer Maternal Uncle   . Heart disease Sister 8       CHF  . Healthy Son   . Healthy Son   . Diabetes Son   . Neuropathy Son     Objective: Office vital signs reviewed. BP 120/78   Pulse 66   Temp 97.6 F (36.4 C) (Oral)   Ht 5\' 3"  (1.6 m)   Wt 116 lb (52.6 kg)   BMI 20.55 kg/m   Physical Examination:  General: Awake, alert, well nourished, well appearing female. No acute distress MSK: Ambulates independently.  Good muscle tone.  She has tortuous varicose veins of bilateral lower extremities  Assessment/ Plan: 78 y.o. female   1. Osteoporosis,  postmenopausal We reviewed her DEXA scan and I have given her information on bisphosphonates and Prolia.  Patient would not be a good candidate for avidity given past medical history of MI.  Her calcium and vitamin D are within normal limits and she would be okay to start either of the medications now.  She will select a medication and contact me when she has made a decision.  2. Coronary artery disease involving native coronary artery of native heart without angina pectoris As above  3. Varicose veins of both lower extremities, unspecified whether complicated I will reach out to vein and vascular to see if there are any physicians in the area that perform radiofrequency treatments of varicose veins.  I will contact the patient once I have more information.   Total time spent with patient 25 minutes.  Greater than 50% of encounter spent in coordination of care/counseling.  Janora Norlander, DO [place] [phone]

## 2018-10-05 NOTE — Patient Instructions (Addendum)
Fosamax, Boniva, Actonel and Prolia are the medications we discussed.  You would not be a good candidate for Evenity (because of its increased risk of heart attack/ complications).  Denosumab injection What is this medicine? DENOSUMAB (den oh sue mab) slows bone breakdown. Prolia is used to treat osteoporosis in women after menopause and in men, and in people who are taking corticosteroids for 6 months or more. Delton See is used to treat a high calcium level due to cancer and to prevent bone fractures and other bone problems caused by multiple myeloma or cancer bone metastases. Delton See is also used to treat giant cell tumor of the bone. This medicine may be used for other purposes; ask your health care provider or pharmacist if you have questions. COMMON BRAND NAME(S): Prolia, XGEVA What should I tell my health care provider before I take this medicine? They need to know if you have any of these conditions: -dental disease -having surgery or tooth extraction -infection -kidney disease -low levels of calcium or Vitamin D in the blood -malnutrition -on hemodialysis -skin conditions or sensitivity -thyroid or parathyroid disease -an unusual reaction to denosumab, other medicines, foods, dyes, or preservatives -pregnant or trying to get pregnant -breast-feeding How should I use this medicine? This medicine is for injection under the skin. It is given by a health care professional in a hospital or clinic setting. A special MedGuide will be given to you before each treatment. Be sure to read this information carefully each time. For Prolia, talk to your pediatrician regarding the use of this medicine in children. Special care may be needed. For Delton See, talk to your pediatrician regarding the use of this medicine in children. While this drug may be prescribed for children as young as 13 years for selected conditions, precautions do apply. Overdosage: If you think you have taken too much of this medicine  contact a poison control center or emergency room at once. NOTE: This medicine is only for you. Do not share this medicine with others. What if I miss a dose? It is important not to miss your dose. Call your doctor or health care professional if you are unable to keep an appointment. What may interact with this medicine? Do not take this medicine with any of the following medications: -other medicines containing denosumab This medicine may also interact with the following medications: -medicines that lower your chance of fighting infection -steroid medicines like prednisone or cortisone This list may not describe all possible interactions. Give your health care provider a list of all the medicines, herbs, non-prescription drugs, or dietary supplements you use. Also tell them if you smoke, drink alcohol, or use illegal drugs. Some items may interact with your medicine. What should I watch for while using this medicine? Visit your doctor or health care professional for regular checks on your progress. Your doctor or health care professional may order blood tests and other tests to see how you are doing. Call your doctor or health care professional for advice if you get a fever, chills or sore throat, or other symptoms of a cold or flu. Do not treat yourself. This drug may decrease your body's ability to fight infection. Try to avoid being around people who are sick. You should make sure you get enough calcium and vitamin D while you are taking this medicine, unless your doctor tells you not to. Discuss the foods you eat and the vitamins you take with your health care professional. See your dentist regularly. Brush and floss your  teeth as directed. Before you have any dental work done, tell your dentist you are receiving this medicine. Do not become pregnant while taking this medicine or for 5 months after stopping it. Talk with your doctor or health care professional about your birth control options  while taking this medicine. Women should inform their doctor if they wish to become pregnant or think they might be pregnant. There is a potential for serious side effects to an unborn child. Talk to your health care professional or pharmacist for more information. What side effects may I notice from receiving this medicine? Side effects that you should report to your doctor or health care professional as soon as possible: -allergic reactions like skin rash, itching or hives, swelling of the face, lips, or tongue -bone pain -breathing problems -dizziness -jaw pain, especially after dental work -redness, blistering, peeling of the skin -signs and symptoms of infection like fever or chills; cough; sore throat; pain or trouble passing urine -signs of low calcium like fast heartbeat, muscle cramps or muscle pain; pain, tingling, numbness in the hands or feet; seizures -unusual bleeding or bruising -unusually weak or tired Side effects that usually do not require medical attention (report to your doctor or health care professional if they continue or are bothersome): -constipation -diarrhea -headache -joint pain -loss of appetite -muscle pain -runny nose -tiredness -upset stomach This list may not describe all possible side effects. Call your doctor for medical advice about side effects. You may report side effects to FDA at 1-800-FDA-1088. Where should I keep my medicine? This medicine is only given in a clinic, doctor's office, or other health care setting and will not be stored at home. NOTE: This sheet is a summary. It may not cover all possible information. If you have questions about this medicine, talk to your doctor, pharmacist, or health care provider.  2019 [employer] Standard (2017-11-26 16:10:44) Ibandronate monthly tablets What is this medicine? IBANDRONATE (i BAN droh nate) slows calcium loss from bones. It is used to treat osteoporosis in women past the age of  menopause. This medicine may be used for other purposes; ask your health care provider or pharmacist if you have questions. COMMON BRAND NAME(S): Boniva What should I tell my health care provider before I take this medicine? They need to know if you have any of these conditions: -dental disease -esophageal, stomach, or intestine problems, like acid reflux or GERD -kidney disease -low blood calcium -low vitamin D -problems sitting or standing for 60 minutes -trouble swallowing -an unusual or allergic reaction to ibandronate, other medicines, foods, dyes, or preservatives -pregnant or trying to get pregnant -breast-feeding How should I use this medicine? You must take this medicine exactly as directed or you will lower the amount of medicine you absorb into your body or you may cause yourself harm. Take your dose by mouth first thing in the morning, after you are up for the day. Do not eat or drink anything before you take this medicine. Swallow the tablet with a full glass (6 to 8 ounces) of plain water. Do not take this medicine with any other drink. Do not chew or crush the tablet. After taking this medicine, do not eat breakfast, drink, or take any other medicines or vitamins for at least 1 hour. Stand or sit up for at least 1 hour after taking this medicine. Do not lie down. Take this medicine on the same day every month. Do not take your medicine more often than directed. Talk to  your pediatrician regarding the use of this medicine in children. Special care may be needed. Overdosage: If you think you have taken too much of this medicine contact a poison control center or emergency room at once. NOTE: This medicine is only for you. Do not share this medicine with others. What if I miss a dose? If you miss a dose and your next dose is more than 7 days away then take the missed dose on the next morning you remember. Then take your next dose on your regular day of the month. If your next dose is  due within the next 7 days then skip the missed dose. Do not take 2 tablets within 1 week of each other. Do not take double or extra doses. What may interact with this medicine? -aluminum hydroxide -antacids -aspirin -calcium supplements -drugs for inflammation like ibuprofen, naproxen, and others -iron supplements -magnesium supplements -vitamins with minerals This list may not describe all possible interactions. Give your health care provider a list of all the medicines, herbs, non-prescription drugs, or dietary supplements you use. Also tell them if you smoke, drink alcohol, or use illegal drugs. Some items may interact with your medicine. What should I watch for while using this medicine? Visit your doctor or health care professional for regular check ups. It may be some time before you see the benefit from this medicine. Do not stop taking your medicine unless your doctor tells you to. Your doctor may order blood tests and other tests to see how you are doing. You should make sure that you get enough calcium and vitamin D while you are taking this medicine. Discuss the foods you eat and the vitamins you take with your health care professional. Some people who take this medicine have severe bone, joint, and/or muscle pain. This medicine may also increase your risk for a broken thigh bone. Tell your doctor right away if you have pain in your upper leg or groin. Tell your doctor if you have any pain that does not go away or that gets worse. What side effects may I notice from receiving this medicine? Side effects that you should report to your doctor or health care professional as soon as possible: -allergic reactions such as skin rash or itching, hives, swelling of the face, lips, throat or tongue -black or tarry stools -change in vision -chest pain -heartburn or stomach pain -jaw pain, especially after dental work -redness, blistering, peeling, or loosening of the skin, including inside  the mouth -trouble or pain when swallowing Side effects that usually do not require medical attention (report to your doctor or health care professional if they continue or are bothersome): -bone, muscle or joint pain -changes in taste -diarrhea or constipation -headache -nausea or vomiting -stomach gas or fullness This list may not describe all possible side effects. Call your doctor for medical advice about side effects. You may report side effects to FDA at 1-800-FDA-1088. Where should I keep my medicine? Keep out of the reach of children. Store at room temperature between 15 and 30 degrees C (59 and 86 degrees F). Throw away any unused medication after the expiration date. NOTE: This sheet is a summary. It may not cover all possible information. If you have questions about this medicine, talk to your doctor, pharmacist, or health care provider.  2019 [employer] Standard (2011-01-16 09:03:09) Risedronate weekly tablets (Actonel) What is this medicine? RISEDRONATE (ris ED roe nate) slows calcium loss from the bone. It helps to make normal healthy  bone and to slow bone loss in people with Paget's disease and osteoporosis. It may also be used in others at risk for bone loss. This medicine may be used for other purposes; ask your health care provider or pharmacist if you have questions. COMMON BRAND NAME(S): Actonel What should I tell my health care provider before I take this medicine? They need to know if you have any of these conditions: -esophageal, stomach, or intestinal problems, like acid reflux or GERD -dental disease -kidney disease -low blood calcium -problems sitting or standing for 30 minutes -trouble swallowing -an unusual or allergic reaction to risedronate, other medicines, foods, dyes, or preservatives -pregnant or trying to get pregnant -breast-feeding How should I use this medicine? You must take this medicine exactly as directly or you will lower the amount of  the medicine that you absorb into your body or you may cause yourself harm. Take this medicine by mouth first thing in the morning, after you are up for the day. Do not eat or drink anything before you take this medicine. Swallow the tablet with a full glass (6 to 8 ounces) of plain water. Do not take this medicine with any other drink. Do not chew, crush, or let the tablet dissolve in your mouth. After taking this medicine, do not eat breakfast, drink, or take any other medicines or vitamins for at least 30 minutes. Stand or sit up for at least 30 minutes after you take this medicine; do not lie down. Take this medicine on the same day every week. Do not take your medicine more often than directed. Talk to your pediatrician regarding the use of this medicine in children. Special care may be needed. Overdosage: If you think you have taken too much of this medicine contact a poison control center or emergency room at once. NOTE: This medicine is only for you. Do not share this medicine with others. What if I miss a dose? If you miss a dose, take the dose on the morning after you remember. Then take your next dose on your regular day of the week. Never take 2 tablets on the same day. Do not take double or extra doses. What may interact with this medicine? -aluminum hydroxide -antacids -aspirin -calcium supplements -drugs for inflammation like ibuprofen, naproxen, and others -iron supplements -magnesium supplements -vitamins with minerals This list may not describe all possible interactions. Give your health care provider a list of all the medicines, herbs, non-prescription drugs, or dietary supplements you use. Also tell them if you smoke, drink alcohol, or use illegal drugs. Some items may interact with your medicine. What should I watch for while using this medicine? Visit your doctor or health care professional for regular check ups. It may be some time before you see the benefit from this  medicine. Do not stop taking your medicine unless your doctor tells you to. Your doctor may order blood tests or other tests to see how you are doing. You should make sure that you get enough calcium and vitamin D while you are taking this medicine. Discuss the foods you eat and the vitamins you take with your health care professional. Some people who take this medicine have severe bone, joint, and/or muscle pain. This medicine may also increase your risk for a broken thigh bone. Tell your doctor right away if you have pain in your upper leg or groin. Tell your doctor if you have any pain that does not go away or that gets worse. What side  effects may I notice from receiving this medicine? Side effects that you should report to your doctor or health care professional as soon as possible: -allergic reactions such as skin rash or itching, hives, swelling of the face, lips, throat, or tongue -black or tarry stools -changes in vision -heartburn or stomach pain -jaw pain, especially after dental work -pain or difficulty when swallowing -redness, blistering, peeling, or loosening of the skin, including inside the mouth Side effects that usually do not require medical attention (report to your doctor or health care professional if they continue or are bothersome): -bone, muscle, or joint pain -changes in taste -diarrhea or constipation -eye pain or itching -headache -nausea or vomiting -stomach gas or fullness This list may not describe all possible side effects. Call your doctor for medical advice about side effects. You may report side effects to FDA at 1-800-FDA-1088. Where should I keep my medicine? Keep out of the reach of children. Store at room temperature between 20 and 25 degrees C (68 and 77 degrees F). Throw away any unused medication after the expiration date. NOTE: This sheet is a summary. It may not cover all possible information. If you have questions about this medicine, talk to your  doctor, pharmacist, or health care provider.  2019 [employer] Standard (2015-08-22 10:27:14) Alendronate weekly tablets What is this medicine? ALENDRONATE (a LEN droe nate) slows calcium loss from bones. It helps to make healthy bone and to slow bone loss in people with osteoporosis. It may be used to treat Paget's disease. This medicine may be used for other purposes; ask your health care provider or pharmacist if you have questions. COMMON BRAND NAME(S): Fosamax What should I tell my health care provider before I take this medicine? They need to know if you have any of these conditions: -esophagus, stomach, or intestine problems, like acid-reflux or GERD -dental disease -kidney disease -low blood calcium -low vitamin D -problems swallowing -problems sitting or standing for 30 minutes -an unusual or allergic reaction to alendronate, other medicines, foods, dyes, or preservatives -pregnant or trying to get pregnant -breast-feeding How should I use this medicine? You must take this medicine exactly as directed or you will lower the amount of medicine you absorb into your body or you may cause yourself harm. Take your dose by mouth first thing in the morning, after you are up for the day. Do not eat or drink anything before you take this medicine. Swallow your medicine with a full glass (6 to 8 fluid ounces) of plain water. Do not take this tablet with any other drink. Do not chew or crush the tablet. After taking this medicine, do not eat breakfast, drink, or take any medicines or vitamins for at least 30 minutes. Stand or sit up for at least 30 minutes after you take this medicine; do not lie down. Take this medicine on the same day every week. Do not take your medicine more often than directed. Talk to your pediatrician regarding the use of this medicine in children. Special care may be needed. Overdosage: If you think you have taken too much of this medicine contact a poison control  center or emergency room at once. NOTE: This medicine is only for you. Do not share this medicine with others. What if I miss a dose? If you miss a dose, take the dose on the morning after you remember. Then take your next dose on your regular day of the week. Never take 2 tablets on the same day. Do  not take double or extra doses. What may interact with this medicine? -aluminum hydroxide -antacids -aspirin -calcium supplements -drugs for inflammation like ibuprofen, naproxen, and others -iron supplements -magnesium supplements -vitamins with minerals This list may not describe all possible interactions. Give your health care provider a list of all the medicines, herbs, non-prescription drugs, or dietary supplements you use. Also tell them if you smoke, drink alcohol, or use illegal drugs. Some items may interact with your medicine. What should I watch for while using this medicine? Visit your doctor or health care professional for regular checks ups. It may be some time before you see benefit from this medicine. Do not stop taking your medication except on your doctor's advice. Your doctor or health care professional may order blood tests and other tests to see how you are doing. You should make sure you get enough calcium and vitamin D while you are taking this medicine, unless your doctor tells you not to. Discuss the foods you eat and the vitamins you take with your health care professional. Some people who take this medicine have severe bone, joint, and/or muscle pain. This medicine may also increase your risk for a broken thigh bone. Tell your doctor right away if you have pain in your upper leg or groin. Tell your doctor if you have any pain that does not go away or that gets worse. This medicine can make you more sensitive to the sun. If you get a rash while taking this medicine, sunlight may cause the rash to get worse. Keep out of the sun. If you cannot avoid being in the sun, wear  protective clothing and use sunscreen. Do not use sun lamps or tanning beds/booths. What side effects may I notice from receiving this medicine? Side effects that you should report to your doctor or health care professional as soon as possible: -allergic reactions like skin rash, itching or hives, swelling of the face, lips, or tongue -black or tarry stools -bone, muscle or joint pain -changes in vision -chest pain -heartburn or stomach pain -jaw pain, especially after dental work -pain or trouble when swallowing -redness, blistering, peeling or loosening of the skin, including inside the mouth Side effects that usually do not require medical attention (report to your doctor or health care professional if they continue or are bothersome): -changes in taste -diarrhea or constipation -eye pain or itching -headache -nausea or vomiting -stomach gas or fullness This list may not describe all possible side effects. Call your doctor for medical advice about side effects. You may report side effects to FDA at 1-800-FDA-1088. Where should I keep my medicine? Keep out of the reach of children. Store at room temperature of 15 and 30 degrees C (59 and 86 degrees F). Throw away any unused medicine after the expiration date. NOTE: This sheet is a summary. It may not cover all possible information. If you have questions about this medicine, talk to your doctor, pharmacist, or health care provider.  2019 [employer] Standard (2011-06-04 09:02:42)

## 2018-10-10 ENCOUNTER — Ambulatory Visit (HOSPITAL_COMMUNITY)
Admission: RE | Admit: 2018-10-10 | Discharge: 2018-10-10 | Disposition: A | Discharge status: Home / Self Care | Payer: Medicare Other | Attending: General Surgery | Admitting: General Surgery

## 2018-10-10 ENCOUNTER — Encounter (HOSPITAL_COMMUNITY): Payer: Self-pay | Admitting: Anesthesiology

## 2018-10-10 ENCOUNTER — Encounter (HOSPITAL_COMMUNITY)
Admission: RE | Disposition: A | Discharge status: Home / Self Care | Payer: Self-pay | Source: Home / Self Care | Attending: General Surgery

## 2018-10-10 DIAGNOSIS — I2510 Atherosclerotic heart disease of native coronary artery without angina pectoris: Secondary | ICD-10-CM | POA: Insufficient documentation

## 2018-10-10 DIAGNOSIS — Z87891 Personal history of nicotine dependence: Secondary | ICD-10-CM | POA: Diagnosis not present

## 2018-10-10 DIAGNOSIS — Z7982 Long term (current) use of aspirin: Secondary | ICD-10-CM | POA: Insufficient documentation

## 2018-10-10 DIAGNOSIS — E785 Hyperlipidemia, unspecified: Secondary | ICD-10-CM | POA: Insufficient documentation

## 2018-10-10 DIAGNOSIS — D1724 Benign lipomatous neoplasm of skin and subcutaneous tissue of left leg: Secondary | ICD-10-CM | POA: Insufficient documentation

## 2018-10-10 DIAGNOSIS — Z79890 Hormone replacement therapy (postmenopausal): Secondary | ICD-10-CM | POA: Insufficient documentation

## 2018-10-10 DIAGNOSIS — Z79899 Other long term (current) drug therapy: Secondary | ICD-10-CM | POA: Insufficient documentation

## 2018-10-10 DIAGNOSIS — J449 Chronic obstructive pulmonary disease, unspecified: Secondary | ICD-10-CM | POA: Diagnosis not present

## 2018-10-10 DIAGNOSIS — D1722 Benign lipomatous neoplasm of skin and subcutaneous tissue of left arm: Secondary | ICD-10-CM | POA: Diagnosis not present

## 2018-10-10 DIAGNOSIS — Z85820 Personal history of malignant melanoma of skin: Secondary | ICD-10-CM | POA: Diagnosis not present

## 2018-10-10 DIAGNOSIS — I252 Old myocardial infarction: Secondary | ICD-10-CM | POA: Insufficient documentation

## 2018-10-10 DIAGNOSIS — D171 Benign lipomatous neoplasm of skin and subcutaneous tissue of trunk: Secondary | ICD-10-CM | POA: Diagnosis not present

## 2018-10-10 HISTORY — PX: LIPOMA EXCISION: SHX5283

## 2018-10-10 SURGERY — MINOR EXCISION LIPOMA
Anesthesia: LOCAL

## 2018-10-10 MED ORDER — LIDOCAINE HCL (PF) 1 % IJ SOLN
INTRAMUSCULAR | Status: AC
  Filled 2018-10-10: qty 30

## 2018-10-10 MED ORDER — LIDOCAINE HCL (PF) 1 % IJ SOLN
INTRAMUSCULAR | Status: DC | PRN
  Administered 2018-10-10: 8 mL
  Administered 2018-10-10 (×2): 5 mL

## 2018-10-10 SURGICAL SUPPLY — 18 items
DECANTER SPIKE VIAL GLASS SM (MISCELLANEOUS) IMPLANT
DERMABOND ADVANCED (GAUZE/BANDAGES/DRESSINGS)
DERMABOND ADVANCED .7 DNX12 (GAUZE/BANDAGES/DRESSINGS) IMPLANT
DURAPREP 26ML APPLICATOR (WOUND CARE) IMPLANT
ELECT NEEDLE TIP 2.8 STRL (NEEDLE) IMPLANT
GLOVE BIO SURGEON STRL SZ 6.5 (GLOVE) IMPLANT
GLOVE BIOGEL PI IND STRL 6.5 (GLOVE) IMPLANT
GLOVE BIOGEL PI IND STRL 7.0 (GLOVE) IMPLANT
GLOVE BIOGEL PI INDICATOR 6.5 (GLOVE)
GLOVE BIOGEL PI INDICATOR 7.0 (GLOVE)
NEEDLE HYPO 25X1 1.5 SAFETY (NEEDLE) IMPLANT
NS IRRIG 1000ML POUR BTL (IV SOLUTION) IMPLANT
SUT ETHILON 3 0 FSL (SUTURE) IMPLANT
SUT MNCRL AB 4-0 PS2 18 (SUTURE) IMPLANT
SUT PROLENE 4 0 PS 2 18 (SUTURE) IMPLANT
SUT VIC AB 3-0 SH 27 (SUTURE)
SUT VIC AB 3-0 SH 27X BRD (SUTURE) IMPLANT
SYR CONTROL 10ML LL (SYRINGE) IMPLANT

## 2018-10-10 NOTE — Discharge Instructions (Signed)
Discharge Instructions: Shower per your regular routine daily.  Do not take hot showers. Take warm showers that are less than 10 minutes. Do not submerge in a bath.  Remove the left arm bandage in 48 hours.  Do not lift > 10 lbs with the left arm or perform excessive stretching, bending, squatting, pushing or pulling for about 1-2 weeks.  Walking and jogging in place should be ok as long as your are not stretching the anterior thigh on the left too much.  Do not pick at the dermabond glue on your incision sites.  This glue film will remain in place for 1-2 weeks and will start to peel off. Do not pick at the stitches in the left arm. You can cover these with a dressing daily as needed. You may have some drainage.  Do not place lotions or balms on your incision unless instructed to specifically by Dr. Constance Haw.   Medication: Take tylenol and ibuprofen as needed for pain control, alternating every 4-6 hours.  Example:  Tylenol 1000mg  @ 6am, 12noon, 6pm, 54midnight (Do not exceed 4000mg  of tylenol a day). Ibuprofen 800mg  @ 9am, 3pm, 9pm, 3am (Do not exceed 3600mg  of ibuprofen a day).  If you decide you need a narcotic pain medication, please call and let us know.   Contact Information: If you have questions or concerns, please call our office, [phone], Monday- Thursday 8AM-5PM and Friday 8AM-12Noon.  If it is after hours or on the weekend, please call [facility], [phone], and ask to speak to the surgeon on call for Dr. Constance Haw at [facility].    Lipoma Removal, Care After Refer to this sheet in the next few weeks. These instructions provide you with information about caring for yourself after your procedure. Your health care provider may also give you more specific instructions. Your treatment has been planned according to current medical practices, but problems sometimes occur. Call your health care provider if you have any problems or questions after your procedure. What can I  expect after the procedure? After the procedure, it is common to have:  Mild pain.  Swelling.  Bruising. Follow these instructions at home:  Bathing  Do not take baths, swim, or use a hot tub until your health care provider approves. Ask your health care provider if you can take showers. You may shower.   Keep your bandage (dressing) dry until your health care provider says it can be removed. Incision care   Follow instructions from your health care provider about how to take care of your incision. Make sure you: ? Wash your hands with soap and water before you change your bandage (dressing). If soap and water are not available, use hand sanitizer. ? Change your dressing as told by your health care provider. ? Leave stitches (sutures), skin glue, or adhesive strips in place. These skin closures may need to stay in place for 2 weeks or longer. If adhesive strip edges start to loosen and curl up, you may trim the loose edges. Do not remove adhesive strips completely unless your health care provider tells you to do that.  Check your incision area every day for signs of infection. Check for: ? More redness, swelling, or pain. ? Fluid or blood. ? Warmth. ? Pus or a bad smell. Driving  Do not drive or operate heavy machinery while taking prescription pain medicine.  Ask your health care provider when it is safe for you to drive. General instructions  Take over-the-counter and prescription  medicines only as told by your health care provider.  Do not use any tobacco products, such as cigarettes, chewing tobacco, and e-cigarettes. These can delay healing. If you need help quitting, ask your health care provider.  Return to your normal activities as told by your health care provider. Ask your health care provider what activities are safe for you.  Keep all follow-up visits as told by your health care provider. This is important. Contact a health care provider if:  You have more  redness, swelling, or pain around your incision.  You have fluid or blood coming from your incision.  Your incision feels warm to the touch.  You have pus or a bad smell coming from your incision.  You have pain that does not get better with medicine. Get help right away if:  You have chills or a fever.  You have severe pain. This information is not intended to replace advice given to you by your health care provider. Make sure you discuss any questions you have with your health care provider. Document Released: 10/03/2015 Document Revised: 12/31/2015 Document Reviewed: 10/03/2015 Elsevier Interactive Patient Education  2019 [employer].

## 2018-10-10 NOTE — Interval H&P Note (Signed)
History and Physical Interval Note:  10/10/2018 10:32 AM  Christine Sandoval  has presented today for surgery, with the diagnosis of lipoma 6cm on left arm, lipoma 2cm on left thigh, lipoma 2cm on back.  The various methods of treatment have been discussed with the patient and family. After consideration of risks, benefits and other options for treatment, the patient has consented to  Procedure(s) with comments: MINOR EXCISION LIPOMA ON LEFT ARM, LEFT THIGH AND BACK (N/A) - pt knows to arrive at 9:00 as a surgical intervention.  The patient's history has been reviewed, patient examined, no change in status, stable for surgery.  I have reviewed the patient's chart and labs.  Questions were answered to the patient's satisfaction.    No questions. Patient going to have lipomas removed from left arm, left thigh, and back.   Virl Cagey

## 2018-10-10 NOTE — Op Note (Signed)
[facility] Procedure Note  10/10/18  Preoperative Diagnosis:  Lipoma of the left arm, left anterior thigh, and back    Postoperative Diagnosis: Same   Procedure(s) Performed:  Excision of the left anterior thigh lipoma 2cm, excision of back lipoma 2cm, excision of left arm lipoma 6cm    Surgeon: Lanell Matar. 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 This area returned and she has also noted a lipoma in her back and her anterior left thigh. She denies any rapid growth or drainage or infection from these areas. She came to my office to discuss removal.  We discussed the risk and benefits of excision including but not limited to bleeding, infection, recurrence, and she opted to proceed.   Findings: Small globular area of fatty tissue in the left anterior thigh, disc shaped lipomatous fat in the back, large lipomatous fat with finger projects and 2 other smaller lipomas adjacent in the left arm    Procedure: The patient was taken to the procedure room and placed semi recumbent position. The left thigh was prepped and draped in the normal sterile fashion and 1% lidocaine was injected over the mobile mass that was palpable. The longitudinal incision was made and carried down through the subcutaneous layer. Blunt dissection was performed and a globular area of fatty tissue was expressed out approximately 2cm in size. This was smaller but no other palpable masses were noted inferiorly. The cavity was hemostatic, and was closed with 3-0 Vicryl interrupted and 4-0 Monocryl subcuticular and dermabond.  Attention was then turned to  the left arm and back. The patient rolled to her right side down, and the left upper arm and back were prepped and draped.  The area overlying the back was injected with 1% lidocaine, and an incision was made over the superficial mobile mass that was to the right of midline.  Blunt dissection with a hemostat and cautery freed up a disc shaped globe of fat that was removed in its entirety approximately 2cm in size.  The cavity was made hemostatic and closed with an interrupted 3-0 vicryl and a subcuticular 4-0 Monocryl and dermabond.    The left upper arm was then injected with 1% lidocaine. The prior incision was in the horizontal fashion across the upper deltoid region. I would have preferred to do a longitudinal incision but given the prior scar, I used this to make the incision over the palpable mass.  The incision was carried down through the subcutaneous tissue, and a large lipomatous mass was eviscerated from the subcutaneous tissue. It was superficial to the muscle. Inferior to this there were two smaller lipomatous masses that were also removed and sent with the same specimen. These totalled over 6 cm in size.  No additional masses were noted. The cavity was made hemostatic. Due to the location, a 3-0 Vicryl interrupted closed the deep space, and a 3-0 prolene interrupted was used to close the skin due to stretch in this area. A sterile dressing with a 2X2 and tegaderm was applied.   All counts were correct at the end of  the case. The patient tolerated the procedure well without issues.    Christine Labrum, MD [facility] [address] [place], [place] [address] [phone] (office)

## 2018-10-11 ENCOUNTER — Encounter (HOSPITAL_COMMUNITY): Payer: Self-pay | Admitting: General Surgery

## 2018-10-12 ENCOUNTER — Telehealth: Payer: Self-pay | Admitting: General Surgery

## 2018-10-12 NOTE — Telephone Encounter (Signed)
[facility]  All pathology were lipomas. Patient notified.  Curlene Labrum, MD [facility] [address] [place], [place] [address] [phone] (office)

## 2018-10-24 ENCOUNTER — Telehealth: Payer: Self-pay | Admitting: General Surgery

## 2018-10-24 NOTE — Telephone Encounter (Signed)
[facility]  The patient is being called or seen in a Webex encounter due to the current Covid 19 virus and attempts at limiting patient clinic visits in the spirit of social distancing.    Ms. Christine Sandoval had lipomas removed in the minor procedure room on 3/9. She does have prolene sutures in place on one of the sites and will need to have these remove. Will plan to see her tomorrow to get these removed.   Christine Labrum, MD [facility] [address] [place], [place] [address] [phone] (office)

## 2018-10-25 ENCOUNTER — Other Ambulatory Visit: Payer: Self-pay

## 2018-10-25 ENCOUNTER — Encounter: Payer: Self-pay | Admitting: General Surgery

## 2018-10-25 ENCOUNTER — Ambulatory Visit (INDEPENDENT_AMBULATORY_CARE_PROVIDER_SITE_OTHER): Payer: Self-pay | Admitting: General Surgery

## 2018-10-25 VITALS — BP 137/77 | HR 78 | Temp 97.3°F | Resp 16 | Wt 115.4 lb

## 2018-10-25 DIAGNOSIS — D1724 Benign lipomatous neoplasm of skin and subcutaneous tissue of left leg: Secondary | ICD-10-CM

## 2018-10-25 DIAGNOSIS — D171 Benign lipomatous neoplasm of skin and subcutaneous tissue of trunk: Secondary | ICD-10-CM

## 2018-10-25 DIAGNOSIS — D1722 Benign lipomatous neoplasm of skin and subcutaneous tissue of left arm: Secondary | ICD-10-CM

## 2018-10-25 NOTE — Patient Instructions (Signed)
Activity as tolerated. Steristrips will fall off in a few days. Shower per routine. Trim glue as it peels.

## 2018-10-25 NOTE — Progress Notes (Signed)
[facility] Note   HPI:  78 y.o. Female presents to clinic for post-op follow-up evaluation after her lipoma removal. She is doing well. The thigh and back are healing well with dermabond. She is trimming this as needed. The arm is healing and she is having less pain at night with sleep and rolling on that side.   Review of Systems:  No fever or chills No redness or drainage All other review of systems: otherwise negative   Vital Signs:  BP 137/77 (BP Location: Left Arm, Patient Position: Sitting, Cuff Size: Normal)   Pulse 78   Temp (!) 97.3 F (36.3 C) (Temporal)   Resp 16   Wt 115 lb 6.4 oz (52.3 kg)   BMI 20.44 kg/m    Physical Exam:  Physical Exam Vitals signs reviewed.  Cardiovascular:     Rate and Rhythm: Normal rate.  Pulmonary:     Effort: Pulmonary effort is normal.  Musculoskeletal:     Comments: Back incisions c/d/i with peeling dermabond, no erythema or drainage, left thigh incision c/d/i with peeling dermabond, some bruising to the side and induration, no erythema or drainage, left arm sutures c/d/i with no erythema or drainage, sutures removed and steri strips applied   Neurological:     Mental Status: She is alert.     Assessment:  78 y.o. yo Female with lipoma excision. Doing well. Sutures removed.   Plan:  Activity as tolerated. Steristrips will fall off in a few days. Shower per routine. Trim glue as it peels.  PRN follow up   All of the above recommendations were discussed with the patient, and all of patient's questions were answered to her expressed satisfaction.  Curlene Labrum, MD [facility] [address] [place], [place] [address] [phone] (office)

## 2018-12-08 ENCOUNTER — Other Ambulatory Visit: Payer: Self-pay

## 2018-12-08 ENCOUNTER — Other Ambulatory Visit: Payer: Medicare Other

## 2018-12-08 DIAGNOSIS — I2510 Atherosclerotic heart disease of native coronary artery without angina pectoris: Secondary | ICD-10-CM | POA: Diagnosis not present

## 2018-12-08 DIAGNOSIS — E7800 Pure hypercholesterolemia, unspecified: Secondary | ICD-10-CM | POA: Diagnosis not present

## 2018-12-09 LAB — HEPATIC FUNCTION PANEL
ALT: 25 IU/L (ref 0–32)
AST: 34 IU/L (ref 0–40)
Albumin: 4.6 g/dL (ref 3.7–4.7)
Alkaline Phosphatase: 74 IU/L (ref 39–117)
Bilirubin Total: 0.5 mg/dL (ref 0.0–1.2)
Bilirubin, Direct: 0.15 mg/dL (ref 0.00–0.40)
Total Protein: 6.4 g/dL (ref 6.0–8.5)

## 2018-12-09 LAB — LIPID PANEL
CHOL/HDL RATIO: 2.1 ratio (ref 0.0–4.4)
Cholesterol, Total: 182 mg/dL (ref 100–199)
HDL: 85 mg/dL (ref >39)
LDL Calculated: 73 mg/dL (ref 0–99)
Triglycerides: 119 mg/dL (ref 0–149)
VLDL Cholesterol Cal: 24 mg/dL (ref 5–40)

## 2019-01-16 DIAGNOSIS — M810 Age-related osteoporosis without current pathological fracture: Secondary | ICD-10-CM | POA: Diagnosis not present

## 2019-01-16 DIAGNOSIS — R5383 Other fatigue: Secondary | ICD-10-CM | POA: Diagnosis not present

## 2019-01-16 DIAGNOSIS — E559 Vitamin D deficiency, unspecified: Secondary | ICD-10-CM | POA: Diagnosis not present

## 2019-01-16 DIAGNOSIS — D513 Other dietary vitamin B12 deficiency anemia: Secondary | ICD-10-CM | POA: Diagnosis not present

## 2019-01-23 DIAGNOSIS — R5383 Other fatigue: Secondary | ICD-10-CM | POA: Diagnosis not present

## 2019-01-23 DIAGNOSIS — E7849 Other hyperlipidemia: Secondary | ICD-10-CM | POA: Diagnosis not present

## 2019-01-23 DIAGNOSIS — J449 Chronic obstructive pulmonary disease, unspecified: Secondary | ICD-10-CM | POA: Diagnosis not present

## 2019-01-23 DIAGNOSIS — G2581 Restless legs syndrome: Secondary | ICD-10-CM | POA: Diagnosis not present

## 2019-01-23 DIAGNOSIS — D513 Other dietary vitamin B12 deficiency anemia: Secondary | ICD-10-CM | POA: Diagnosis not present

## 2019-01-23 DIAGNOSIS — E038 Other specified hypothyroidism: Secondary | ICD-10-CM | POA: Diagnosis not present

## 2019-01-23 DIAGNOSIS — M810 Age-related osteoporosis without current pathological fracture: Secondary | ICD-10-CM | POA: Diagnosis not present

## 2019-01-23 DIAGNOSIS — E559 Vitamin D deficiency, unspecified: Secondary | ICD-10-CM | POA: Diagnosis not present

## 2019-01-27 DIAGNOSIS — R5383 Other fatigue: Secondary | ICD-10-CM | POA: Diagnosis not present

## 2019-01-27 DIAGNOSIS — J449 Chronic obstructive pulmonary disease, unspecified: Secondary | ICD-10-CM | POA: Diagnosis not present

## 2019-01-27 DIAGNOSIS — R634 Abnormal weight loss: Secondary | ICD-10-CM | POA: Diagnosis not present

## 2019-02-08 DIAGNOSIS — M810 Age-related osteoporosis without current pathological fracture: Secondary | ICD-10-CM | POA: Diagnosis not present

## 2019-02-08 DIAGNOSIS — G2581 Restless legs syndrome: Secondary | ICD-10-CM | POA: Diagnosis not present

## 2019-02-08 DIAGNOSIS — R5383 Other fatigue: Secondary | ICD-10-CM | POA: Diagnosis not present

## 2019-02-08 DIAGNOSIS — J449 Chronic obstructive pulmonary disease, unspecified: Secondary | ICD-10-CM | POA: Diagnosis not present

## 2019-04-20 DIAGNOSIS — J322 Chronic ethmoidal sinusitis: Secondary | ICD-10-CM | POA: Diagnosis not present

## 2019-04-20 DIAGNOSIS — R5383 Other fatigue: Secondary | ICD-10-CM | POA: Diagnosis not present

## 2019-04-20 DIAGNOSIS — M810 Age-related osteoporosis without current pathological fracture: Secondary | ICD-10-CM | POA: Diagnosis not present

## 2019-04-20 DIAGNOSIS — G2581 Restless legs syndrome: Secondary | ICD-10-CM | POA: Diagnosis not present

## 2019-05-20 DIAGNOSIS — Z23 Encounter for immunization: Secondary | ICD-10-CM | POA: Diagnosis not present

## 2019-06-14 DIAGNOSIS — I252 Old myocardial infarction: Secondary | ICD-10-CM | POA: Diagnosis not present

## 2019-06-14 DIAGNOSIS — E785 Hyperlipidemia, unspecified: Secondary | ICD-10-CM | POA: Diagnosis not present

## 2019-06-22 DIAGNOSIS — D692 Other nonthrombocytopenic purpura: Secondary | ICD-10-CM | POA: Diagnosis not present

## 2019-06-22 DIAGNOSIS — L814 Other melanin hyperpigmentation: Secondary | ICD-10-CM | POA: Diagnosis not present

## 2019-06-22 DIAGNOSIS — L570 Actinic keratosis: Secondary | ICD-10-CM | POA: Diagnosis not present

## 2019-06-22 DIAGNOSIS — L821 Other seborrheic keratosis: Secondary | ICD-10-CM | POA: Diagnosis not present

## 2019-06-22 DIAGNOSIS — X32XXXA Exposure to sunlight, initial encounter: Secondary | ICD-10-CM | POA: Diagnosis not present

## 2019-06-22 DIAGNOSIS — L910 Hypertrophic scar: Secondary | ICD-10-CM | POA: Diagnosis not present

## 2019-06-22 DIAGNOSIS — L718 Other rosacea: Secondary | ICD-10-CM | POA: Diagnosis not present

## 2019-09-21 DIAGNOSIS — E785 Hyperlipidemia, unspecified: Secondary | ICD-10-CM | POA: Diagnosis not present

## 2019-09-21 DIAGNOSIS — I252 Old myocardial infarction: Secondary | ICD-10-CM | POA: Diagnosis not present
